# Patient Record
Sex: Female | Born: 1935 | Race: White | Hispanic: No
Health system: Southern US, Community
[De-identification: ages and names within clinical notes are randomized; demographics above are authoritative.]

---

## 2003-02-13 ENCOUNTER — Other Ambulatory Visit: Payer: Self-pay

## 2004-04-03 ENCOUNTER — Ambulatory Visit: Payer: Self-pay | Admitting: Ophthalmology

## 2004-04-08 ENCOUNTER — Ambulatory Visit: Payer: Self-pay | Admitting: Ophthalmology

## 2004-11-19 ENCOUNTER — Ambulatory Visit: Payer: Self-pay | Admitting: Internal Medicine

## 2004-12-25 ENCOUNTER — Ambulatory Visit: Payer: Self-pay | Admitting: Internal Medicine

## 2005-01-02 ENCOUNTER — Ambulatory Visit: Payer: Self-pay | Admitting: Internal Medicine

## 2005-07-06 ENCOUNTER — Ambulatory Visit: Payer: Self-pay | Admitting: Internal Medicine

## 2006-03-04 ENCOUNTER — Ambulatory Visit: Payer: Self-pay | Admitting: Internal Medicine

## 2006-06-23 ENCOUNTER — Inpatient Hospital Stay: Payer: Self-pay | Admitting: Internal Medicine

## 2007-03-17 ENCOUNTER — Ambulatory Visit: Payer: Self-pay | Admitting: Internal Medicine

## 2007-10-03 ENCOUNTER — Ambulatory Visit: Payer: Self-pay | Admitting: Unknown Physician Specialty

## 2007-10-10 ENCOUNTER — Inpatient Hospital Stay: Payer: Self-pay | Admitting: Unknown Physician Specialty

## 2007-10-15 ENCOUNTER — Encounter: Payer: Self-pay | Admitting: Internal Medicine

## 2007-11-05 ENCOUNTER — Encounter: Payer: Self-pay | Admitting: Internal Medicine

## 2007-11-23 ENCOUNTER — Ambulatory Visit: Payer: Self-pay | Admitting: Internal Medicine

## 2007-12-01 ENCOUNTER — Ambulatory Visit: Payer: Self-pay | Admitting: Internal Medicine

## 2007-12-06 ENCOUNTER — Ambulatory Visit: Payer: Self-pay | Admitting: Internal Medicine

## 2007-12-08 ENCOUNTER — Encounter: Payer: Self-pay | Admitting: Unknown Physician Specialty

## 2007-12-18 ENCOUNTER — Other Ambulatory Visit: Payer: Self-pay

## 2007-12-18 ENCOUNTER — Inpatient Hospital Stay: Payer: Self-pay | Admitting: Internal Medicine

## 2007-12-29 ENCOUNTER — Encounter: Payer: Self-pay | Admitting: Internal Medicine

## 2008-01-05 ENCOUNTER — Encounter: Payer: Self-pay | Admitting: Internal Medicine

## 2008-01-11 ENCOUNTER — Encounter: Payer: Self-pay | Admitting: Unknown Physician Specialty

## 2008-01-12 ENCOUNTER — Ambulatory Visit: Payer: Self-pay | Admitting: Internal Medicine

## 2008-02-05 ENCOUNTER — Ambulatory Visit: Payer: Self-pay | Admitting: Internal Medicine

## 2008-05-17 ENCOUNTER — Ambulatory Visit: Payer: Self-pay | Admitting: Internal Medicine

## 2008-07-19 ENCOUNTER — Ambulatory Visit: Payer: Self-pay | Admitting: Anesthesiology

## 2008-07-27 ENCOUNTER — Ambulatory Visit: Payer: Self-pay | Admitting: Anesthesiology

## 2008-08-15 ENCOUNTER — Inpatient Hospital Stay: Payer: Self-pay | Admitting: Internal Medicine

## 2008-09-03 ENCOUNTER — Ambulatory Visit: Payer: Self-pay | Admitting: Anesthesiology

## 2009-05-20 ENCOUNTER — Ambulatory Visit: Payer: Self-pay | Admitting: Internal Medicine

## 2009-10-28 ENCOUNTER — Ambulatory Visit: Payer: Self-pay | Admitting: Pain Medicine

## 2009-10-29 ENCOUNTER — Ambulatory Visit: Payer: Self-pay | Admitting: Pain Medicine

## 2009-11-11 ENCOUNTER — Ambulatory Visit: Payer: Self-pay | Admitting: Pain Medicine

## 2009-11-14 ENCOUNTER — Ambulatory Visit: Payer: Self-pay | Admitting: Pain Medicine

## 2009-11-27 ENCOUNTER — Ambulatory Visit: Payer: Self-pay | Admitting: Pain Medicine

## 2009-12-10 ENCOUNTER — Ambulatory Visit: Payer: Self-pay | Admitting: Pain Medicine

## 2009-12-23 ENCOUNTER — Ambulatory Visit: Payer: Self-pay | Admitting: Pain Medicine

## 2009-12-26 ENCOUNTER — Ambulatory Visit: Payer: Self-pay | Admitting: Pain Medicine

## 2010-01-09 ENCOUNTER — Ambulatory Visit: Payer: Self-pay | Admitting: Pain Medicine

## 2010-01-20 ENCOUNTER — Ambulatory Visit: Payer: Self-pay | Admitting: Pain Medicine

## 2010-05-15 ENCOUNTER — Ambulatory Visit: Payer: Self-pay | Admitting: Pain Medicine

## 2010-05-29 ENCOUNTER — Ambulatory Visit: Payer: Self-pay | Admitting: Pain Medicine

## 2010-06-16 ENCOUNTER — Ambulatory Visit: Payer: Self-pay | Admitting: Pain Medicine

## 2010-06-23 ENCOUNTER — Ambulatory Visit: Payer: Self-pay | Admitting: Internal Medicine

## 2010-07-21 ENCOUNTER — Inpatient Hospital Stay: Payer: Self-pay | Admitting: Internal Medicine

## 2010-07-25 LAB — CA 125: CA 125: 9.7 U/mL (ref 0.0–34.0)

## 2010-11-06 ENCOUNTER — Ambulatory Visit: Payer: Self-pay | Admitting: Pain Medicine

## 2010-11-20 ENCOUNTER — Ambulatory Visit: Payer: Self-pay | Admitting: Pain Medicine

## 2010-11-24 ENCOUNTER — Ambulatory Visit: Payer: Self-pay | Admitting: Pain Medicine

## 2010-11-27 ENCOUNTER — Ambulatory Visit: Payer: Self-pay

## 2010-12-15 ENCOUNTER — Ambulatory Visit: Payer: Self-pay

## 2010-12-26 ENCOUNTER — Other Ambulatory Visit: Payer: Self-pay | Admitting: Cardiology

## 2011-01-13 ENCOUNTER — Ambulatory Visit: Payer: Self-pay | Admitting: Cardiology

## 2011-02-02 ENCOUNTER — Inpatient Hospital Stay: Payer: Self-pay | Admitting: Internal Medicine

## 2011-02-20 ENCOUNTER — Inpatient Hospital Stay: Payer: Self-pay | Admitting: Internal Medicine

## 2011-03-03 ENCOUNTER — Encounter: Payer: Self-pay | Admitting: Internal Medicine

## 2011-03-07 ENCOUNTER — Encounter: Payer: Self-pay | Admitting: Internal Medicine

## 2011-03-19 ENCOUNTER — Emergency Department: Payer: Self-pay | Admitting: *Deleted

## 2011-04-07 ENCOUNTER — Encounter: Payer: Self-pay | Admitting: Internal Medicine

## 2011-04-30 ENCOUNTER — Other Ambulatory Visit: Payer: Self-pay

## 2011-04-30 ENCOUNTER — Inpatient Hospital Stay: Payer: Self-pay | Admitting: Internal Medicine

## 2011-04-30 LAB — BASIC METABOLIC PANEL
Anion Gap: 21 — ABNORMAL HIGH (ref 7–16)
BUN: 70 mg/dL — ABNORMAL HIGH (ref 7–18)
Chloride: 90 mmol/L — ABNORMAL LOW (ref 98–107)
Creatinine: 4.09 mg/dL — ABNORMAL HIGH (ref 0.60–1.30)
EGFR (African American): 14 — ABNORMAL LOW
EGFR (Non-African Amer.): 11 — ABNORMAL LOW
Glucose: 72 mg/dL (ref 65–99)
Osmolality: 284 (ref 275–301)
Potassium: 4.5 mmol/L (ref 3.5–5.1)

## 2011-04-30 LAB — HEPATIC FUNCTION PANEL A (ARMC)
Alkaline Phosphatase: 49 U/L — ABNORMAL LOW (ref 50–136)
Bilirubin, Direct: 0.4 mg/dL — ABNORMAL HIGH (ref 0.00–0.20)
SGOT(AST): 29 U/L (ref 15–37)
Total Protein: 7.5 g/dL (ref 6.4–8.2)

## 2011-04-30 LAB — COMPREHENSIVE METABOLIC PANEL
Albumin: 4.1 g/dL (ref 3.4–5.0)
Alkaline Phosphatase: 58 U/L (ref 50–136)
Anion Gap: 21 — ABNORMAL HIGH (ref 7–16)
BUN: 66 mg/dL — ABNORMAL HIGH (ref 7–18)
Co2: 20 mmol/L — ABNORMAL LOW (ref 21–32)
Creatinine: 3.35 mg/dL — ABNORMAL HIGH (ref 0.60–1.30)
Glucose: 79 mg/dL (ref 65–99)
Potassium: 4.7 mmol/L (ref 3.5–5.1)
SGOT(AST): 28 U/L (ref 15–37)
SGPT (ALT): 28 U/L
Sodium: 129 mmol/L — ABNORMAL LOW (ref 136–145)
Total Protein: 8.1 g/dL (ref 6.4–8.2)

## 2011-04-30 LAB — TROPONIN I: Troponin-I: <0.02 ng/mL

## 2011-05-01 LAB — CBC WITH DIFFERENTIAL/PLATELET
Basophil #: 0 10*3/uL (ref 0.0–0.1)
Basophil %: 0 %
Eosinophil #: 0 10*3/uL (ref 0.0–0.7)
Lymphocyte #: 0.3 10*3/uL — ABNORMAL LOW (ref 1.0–3.6)
Lymphocyte %: 3.1 %
MCH: 31.9 pg (ref 26.0–34.0)
MCV: 95 fL (ref 80–100)
Monocyte #: 0.7 10*3/uL (ref 0.0–0.7)
Monocyte %: 6.5 %
Neutrophil %: 90.4 %
Platelet: 133 10*3/uL — ABNORMAL LOW (ref 150–440)
RBC: 3.89 10*6/uL (ref 3.80–5.20)
RDW: 17 % — ABNORMAL HIGH (ref 11.5–14.5)
WBC: 10.9 10*3/uL (ref 3.6–11.0)

## 2011-05-01 LAB — URINALYSIS, COMPLETE
Blood: NEGATIVE
Nitrite: NEGATIVE
Ph: 5 (ref 4.5–8.0)
Specific Gravity: 1.012 (ref 1.003–1.030)

## 2011-05-01 LAB — COMPREHENSIVE METABOLIC PANEL
BUN: 69 mg/dL — ABNORMAL HIGH (ref 7–18)
Bilirubin,Total: 1.1 mg/dL — ABNORMAL HIGH (ref 0.2–1.0)
Calcium, Total: 8.4 mg/dL — ABNORMAL LOW (ref 8.5–10.1)
Chloride: 98 mmol/L (ref 98–107)
Co2: 15 mmol/L — ABNORMAL LOW (ref 21–32)
Creatinine: 3.41 mg/dL — ABNORMAL HIGH (ref 0.60–1.30)
EGFR (African American): 17 — ABNORMAL LOW
EGFR (Non-African Amer.): 14 — ABNORMAL LOW
Potassium: 5.7 mmol/L — ABNORMAL HIGH (ref 3.5–5.1)
SGOT(AST): 24 U/L (ref 15–37)
SGPT (ALT): 20 U/L

## 2011-05-01 LAB — TROPONIN I: Troponin-I: <0.02 ng/mL

## 2011-05-01 LAB — POTASSIUM: Potassium: 5.4 mmol/L — ABNORMAL HIGH (ref 3.5–5.1)

## 2011-05-02 LAB — CBC WITH DIFFERENTIAL/PLATELET
Eosinophil #: 0 10*3/uL (ref 0.0–0.7)
HCT: 33.7 % — ABNORMAL LOW (ref 35.0–47.0)
Lymphocyte #: 0.6 10*3/uL — ABNORMAL LOW (ref 1.0–3.6)
Lymphocyte %: 11.2 %
MCH: 31.3 pg (ref 26.0–34.0)
MCHC: 32.7 g/dL (ref 32.0–36.0)
MCV: 96 fL (ref 80–100)
Monocyte #: 0.8 10*3/uL — ABNORMAL HIGH (ref 0.0–0.7)
Monocyte %: 13.8 %
Neutrophil #: 4.1 10*3/uL (ref 1.4–6.5)
Platelet: 112 10*3/uL — ABNORMAL LOW (ref 150–440)
RDW: 16.9 % — ABNORMAL HIGH (ref 11.5–14.5)
WBC: 5.6 10*3/uL (ref 3.6–11.0)

## 2011-05-02 LAB — BASIC METABOLIC PANEL
Anion Gap: 13 (ref 7–16)
BUN: 60 mg/dL — ABNORMAL HIGH (ref 7–18)
Co2: 26 mmol/L (ref 21–32)
Creatinine: 2.27 mg/dL — ABNORMAL HIGH (ref 0.60–1.30)
EGFR (African American): 27 — ABNORMAL LOW
EGFR (Non-African Amer.): 22 — ABNORMAL LOW
Glucose: 86 mg/dL (ref 65–99)
Osmolality: 292 (ref 275–301)

## 2011-05-03 LAB — BASIC METABOLIC PANEL
BUN: 45 mg/dL — ABNORMAL HIGH (ref 7–18)
Calcium, Total: 8.7 mg/dL (ref 8.5–10.1)
Chloride: 99 mmol/L (ref 98–107)
Co2: 28 mmol/L (ref 21–32)
EGFR (African American): 41 — ABNORMAL LOW
Potassium: 3.5 mmol/L (ref 3.5–5.1)
Sodium: 139 mmol/L (ref 136–145)

## 2011-05-03 LAB — URINALYSIS, COMPLETE
Bilirubin,UR: NEGATIVE
Glucose,UR: NEGATIVE mg/dL (ref 0–75)
Nitrite: NEGATIVE
Ph: 5 (ref 4.5–8.0)
Specific Gravity: 1.016 (ref 1.003–1.030)
Squamous Epithelial: 3
WBC UR: 264 /HPF (ref 0–5)

## 2011-05-04 LAB — UR PROT ELECTROPHORESIS, URINE RANDOM

## 2011-05-05 LAB — BASIC METABOLIC PANEL
Calcium, Total: 8.7 mg/dL (ref 8.5–10.1)
Chloride: 103 mmol/L (ref 98–107)
Co2: 27 mmol/L (ref 21–32)
Creatinine: 1.3 mg/dL (ref 0.60–1.30)
EGFR (Non-African Amer.): 42 — ABNORMAL LOW
Potassium: 3.4 mmol/L — ABNORMAL LOW (ref 3.5–5.1)
Sodium: 141 mmol/L (ref 136–145)

## 2011-05-05 LAB — URINE CULTURE

## 2011-05-05 LAB — PROTEIN ELECTROPHORESIS(ARMC)

## 2011-05-06 LAB — URINE CULTURE

## 2011-05-06 LAB — PATHOLOGY REPORT

## 2011-05-06 LAB — BASIC METABOLIC PANEL
Anion Gap: 13 (ref 7–16)
Calcium, Total: 8.8 mg/dL (ref 8.5–10.1)
Co2: 23 mmol/L (ref 21–32)
EGFR (African American): 52 — ABNORMAL LOW
Osmolality: 283 (ref 275–301)
Sodium: 141 mmol/L (ref 136–145)

## 2011-05-08 ENCOUNTER — Ambulatory Visit: Payer: Self-pay | Admitting: Internal Medicine

## 2011-05-08 LAB — BASIC METABOLIC PANEL
Anion Gap: 13 (ref 7–16)
Chloride: 110 mmol/L — ABNORMAL HIGH (ref 98–107)
Creatinine: 1.16 mg/dL (ref 0.60–1.30)
EGFR (African American): 58 — ABNORMAL LOW
Glucose: 72 mg/dL (ref 65–99)

## 2011-05-08 LAB — CLOSTRIDIUM DIFFICILE BY PCR

## 2011-05-08 LAB — PLATELET COUNT: Platelet: 152 10*3/uL (ref 150–440)

## 2011-05-08 LAB — HEMATOCRIT: HCT: 33.2 % — ABNORMAL LOW (ref 35.0–47.0)

## 2011-05-09 LAB — BASIC METABOLIC PANEL
BUN: 16 mg/dL (ref 7–18)
Calcium, Total: 8.8 mg/dL (ref 8.5–10.1)
Chloride: 108 mmol/L — ABNORMAL HIGH (ref 98–107)
EGFR (Non-African Amer.): 44 — ABNORMAL LOW
Glucose: 71 mg/dL (ref 65–99)
Potassium: 3.4 mmol/L — ABNORMAL LOW (ref 3.5–5.1)
Sodium: 143 mmol/L (ref 136–145)

## 2011-05-09 LAB — MAGNESIUM: Magnesium: 1.8 mg/dL

## 2011-05-10 LAB — BASIC METABOLIC PANEL
Chloride: 107 mmol/L (ref 98–107)
Co2: 25 mmol/L (ref 21–32)
Creatinine: 1.4 mg/dL — ABNORMAL HIGH (ref 0.60–1.30)
Glucose: 73 mg/dL (ref 65–99)
Potassium: 4 mmol/L (ref 3.5–5.1)
Sodium: 142 mmol/L (ref 136–145)

## 2011-05-10 LAB — PLATELET COUNT: Platelet: 164 10*3/uL (ref 150–440)

## 2011-05-10 LAB — HEMATOCRIT: HCT: 32.1 % — ABNORMAL LOW (ref 35.0–47.0)

## 2011-05-12 LAB — BASIC METABOLIC PANEL
Anion Gap: 11 (ref 7–16)
BUN: 17 mg/dL (ref 7–18)
Calcium, Total: 8.7 mg/dL (ref 8.5–10.1)
Creatinine: 1.2 mg/dL (ref 0.60–1.30)
EGFR (African American): 56 — ABNORMAL LOW
EGFR (Non-African Amer.): 46 — ABNORMAL LOW
Glucose: 74 mg/dL (ref 65–99)
Osmolality: 281 (ref 275–301)

## 2011-05-12 LAB — CBC WITH DIFFERENTIAL/PLATELET
Basophil #: 0 10*3/uL (ref 0.0–0.1)
Eosinophil #: 0 10*3/uL (ref 0.0–0.7)
HCT: 31.3 % — ABNORMAL LOW (ref 35.0–47.0)
HGB: 10.1 g/dL — ABNORMAL LOW (ref 12.0–16.0)
Lymphocyte %: 14.5 %
MCH: 31.1 pg (ref 26.0–34.0)
MCHC: 32.3 g/dL (ref 32.0–36.0)
MCV: 96 fL (ref 80–100)
Neutrophil %: 77 %
RBC: 3.25 10*6/uL — ABNORMAL LOW (ref 3.80–5.20)
WBC: 7.1 10*3/uL (ref 3.6–11.0)

## 2011-05-14 LAB — COMPREHENSIVE METABOLIC PANEL
Albumin: 2.5 g/dL — ABNORMAL LOW (ref 3.4–5.0)
Calcium, Total: 8.6 mg/dL (ref 8.5–10.1)
Chloride: 107 mmol/L (ref 98–107)
Co2: 23 mmol/L (ref 21–32)
SGOT(AST): 18 U/L (ref 15–37)
SGPT (ALT): 23 U/L
Sodium: 140 mmol/L (ref 136–145)
Total Protein: 5.2 g/dL — ABNORMAL LOW (ref 6.4–8.2)

## 2011-05-14 LAB — APTT: Activated PTT: 29.8 secs (ref 23.6–35.9)

## 2011-05-14 LAB — MAGNESIUM: Magnesium: 1.8 mg/dL

## 2011-05-15 LAB — URINALYSIS, COMPLETE
Bacteria: NONE SEEN
Bilirubin,UR: NEGATIVE
Glucose,UR: NEGATIVE mg/dL (ref 0–75)
Leukocyte Esterase: NEGATIVE
Nitrite: NEGATIVE
RBC,UR: 18 /HPF (ref 0–5)
Squamous Epithelial: 1
WBC UR: 5 /HPF (ref 0–5)

## 2011-05-15 LAB — COMPREHENSIVE METABOLIC PANEL
Alkaline Phosphatase: 35 U/L — ABNORMAL LOW (ref 50–136)
Anion Gap: 13 (ref 7–16)
BUN: 16 mg/dL (ref 7–18)
Creatinine: 0.91 mg/dL (ref 0.60–1.30)
EGFR (African American): >60
EGFR (Non-African Amer.): >60
Osmolality: 280 (ref 275–301)
SGPT (ALT): 23 U/L
Total Protein: 5.5 g/dL — ABNORMAL LOW (ref 6.4–8.2)

## 2011-05-16 LAB — PLATELET COUNT: Platelet: 159 10*3/uL (ref 150–440)

## 2011-05-19 LAB — CBC WITH DIFFERENTIAL/PLATELET
Basophil #: 0 10*3/uL (ref 0.0–0.1)
Basophil %: 0.7 %
Eosinophil %: 2.3 %
HCT: 28.6 % — ABNORMAL LOW (ref 35.0–47.0)
HGB: 9.5 g/dL — ABNORMAL LOW (ref 12.0–16.0)
Lymphocyte #: 1.2 10*3/uL (ref 1.0–3.6)
MCH: 32 pg (ref 26.0–34.0)
Monocyte #: 0.5 10*3/uL (ref 0.0–0.7)
Monocyte %: 10.5 %
RBC: 2.98 10*6/uL — ABNORMAL LOW (ref 3.80–5.20)
RDW: 16.8 % — ABNORMAL HIGH (ref 11.5–14.5)
WBC: 4.6 10*3/uL (ref 3.6–11.0)

## 2011-05-19 LAB — BASIC METABOLIC PANEL
Anion Gap: 11 (ref 7–16)
BUN: 11 mg/dL (ref 7–18)
Chloride: 106 mmol/L (ref 98–107)
Co2: 27 mmol/L (ref 21–32)
EGFR (Non-African Amer.): 60 — ABNORMAL LOW
Osmolality: 286 (ref 275–301)

## 2011-05-19 LAB — MAGNESIUM: Magnesium: 2.4 mg/dL

## 2011-05-20 LAB — BASIC METABOLIC PANEL
Anion Gap: 9 (ref 7–16)
BUN: 9 mg/dL (ref 7–18)
Calcium, Total: 8.1 mg/dL — ABNORMAL LOW (ref 8.5–10.1)
Chloride: 107 mmol/L (ref 98–107)
Creatinine: 0.95 mg/dL (ref 0.60–1.30)
EGFR (Non-African Amer.): >60
Osmolality: 282 (ref 275–301)
Potassium: 3.6 mmol/L (ref 3.5–5.1)
Sodium: 142 mmol/L (ref 136–145)

## 2011-05-21 LAB — COMPREHENSIVE METABOLIC PANEL
Albumin: 2.5 g/dL — ABNORMAL LOW (ref 3.4–5.0)
Alkaline Phosphatase: 37 U/L — ABNORMAL LOW (ref 50–136)
Anion Gap: 6 — ABNORMAL LOW (ref 7–16)
Calcium, Total: 8.6 mg/dL (ref 8.5–10.1)
Co2: 26 mmol/L (ref 21–32)
Creatinine: 1.08 mg/dL (ref 0.60–1.30)
Glucose: 74 mg/dL (ref 65–99)
Potassium: 4.1 mmol/L (ref 3.5–5.1)
SGOT(AST): 27 U/L (ref 15–37)
SGPT (ALT): 29 U/L
Sodium: 138 mmol/L (ref 136–145)
Total Protein: 5.2 g/dL — ABNORMAL LOW (ref 6.4–8.2)

## 2011-05-21 LAB — CBC WITH DIFFERENTIAL/PLATELET
Basophil #: 0 10*3/uL (ref 0.0–0.1)
Basophil %: 0.4 %
HCT: 30.1 % — ABNORMAL LOW (ref 35.0–47.0)
HGB: 9.9 g/dL — ABNORMAL LOW (ref 12.0–16.0)
Lymphocyte %: 20.3 %
MCH: 31.7 pg (ref 26.0–34.0)
MCHC: 32.7 g/dL (ref 32.0–36.0)
Monocyte %: 7.8 %
Neutrophil #: 4.1 10*3/uL (ref 1.4–6.5)
RDW: 17.2 % — ABNORMAL HIGH (ref 11.5–14.5)
WBC: 6.1 10*3/uL (ref 3.6–11.0)

## 2011-05-21 LAB — PROTIME-INR
INR: 0.9
Prothrombin Time: 12.1 secs (ref 11.5–14.7)

## 2011-05-22 LAB — CBC WITH DIFFERENTIAL/PLATELET
Basophil %: 0 %
Eosinophil #: 0 10*3/uL (ref 0.0–0.7)
Eosinophil %: 0.1 %
HCT: 29.5 % — ABNORMAL LOW (ref 35.0–47.0)
HGB: 9.8 g/dL — ABNORMAL LOW (ref 12.0–16.0)
Lymphocyte #: 0.6 10*3/uL — ABNORMAL LOW (ref 1.0–3.6)
MCHC: 33.1 g/dL (ref 32.0–36.0)
MCV: 97 fL (ref 80–100)
Monocyte %: 5.3 %
Neutrophil #: 11.6 10*3/uL — ABNORMAL HIGH (ref 1.4–6.5)
Neutrophil %: 89.7 %
WBC: 12.9 10*3/uL — ABNORMAL HIGH (ref 3.6–11.0)

## 2011-05-22 LAB — COMPREHENSIVE METABOLIC PANEL
Albumin: 2.4 g/dL — ABNORMAL LOW (ref 3.4–5.0)
Alkaline Phosphatase: 38 U/L — ABNORMAL LOW (ref 50–136)
Bilirubin,Total: 0.4 mg/dL (ref 0.2–1.0)
Calcium, Total: 8.5 mg/dL (ref 8.5–10.1)
Chloride: 107 mmol/L (ref 98–107)
Co2: 23 mmol/L (ref 21–32)
Creatinine: 1.2 mg/dL (ref 0.60–1.30)
Glucose: 118 mg/dL — ABNORMAL HIGH (ref 65–99)
Osmolality: 278 (ref 275–301)
SGOT(AST): 52 U/L — ABNORMAL HIGH (ref 15–37)
SGPT (ALT): 66 U/L
Total Protein: 5 g/dL — ABNORMAL LOW (ref 6.4–8.2)

## 2011-05-22 LAB — POTASSIUM: Potassium: 5.1 mmol/L (ref 3.5–5.1)

## 2011-05-23 LAB — BASIC METABOLIC PANEL
Calcium, Total: 9.2 mg/dL (ref 8.5–10.1)
Co2: 24 mmol/L (ref 21–32)
EGFR (African American): 57 — ABNORMAL LOW
EGFR (Non-African Amer.): 47 — ABNORMAL LOW
Glucose: 84 mg/dL (ref 65–99)
Potassium: 4.5 mmol/L (ref 3.5–5.1)
Sodium: 142 mmol/L (ref 136–145)

## 2011-05-23 LAB — CBC WITH DIFFERENTIAL/PLATELET
Basophil %: 0.4 %
Eosinophil %: 3.3 %
HGB: 8.8 g/dL — ABNORMAL LOW (ref 12.0–16.0)
Lymphocyte #: 1 10*3/uL (ref 1.0–3.6)
MCH: 31.9 pg (ref 26.0–34.0)
MCV: 96 fL (ref 80–100)
Monocyte #: 0.4 10*3/uL (ref 0.0–0.7)
Neutrophil %: 72.7 %
RBC: 2.75 10*6/uL — ABNORMAL LOW (ref 3.80–5.20)
WBC: 6.2 10*3/uL (ref 3.6–11.0)

## 2011-05-24 LAB — BASIC METABOLIC PANEL
Calcium, Total: 9.3 mg/dL (ref 8.5–10.1)
Chloride: 106 mmol/L (ref 98–107)
Creatinine: 1.23 mg/dL (ref 0.60–1.30)
EGFR (African American): 54 — ABNORMAL LOW
EGFR (Non-African Amer.): 45 — ABNORMAL LOW
Glucose: 79 mg/dL (ref 65–99)
Osmolality: 283 (ref 275–301)
Potassium: 4.2 mmol/L (ref 3.5–5.1)
Sodium: 143 mmol/L (ref 136–145)

## 2011-05-24 LAB — CBC WITH DIFFERENTIAL/PLATELET
Basophil %: 0.6 %
Eosinophil %: 5.1 %
HCT: 27.3 % — ABNORMAL LOW (ref 35.0–47.0)
HGB: 8.9 g/dL — ABNORMAL LOW (ref 12.0–16.0)
Lymphocyte #: 1.3 10*3/uL (ref 1.0–3.6)
Lymphocyte %: 23.6 %
MCV: 96 fL (ref 80–100)
Monocyte %: 9.4 %
Neutrophil #: 3.3 10*3/uL (ref 1.4–6.5)
Platelet: 152 10*3/uL (ref 150–440)
WBC: 5.4 10*3/uL (ref 3.6–11.0)

## 2011-05-26 LAB — CBC WITH DIFFERENTIAL/PLATELET
Basophil #: 0 10*3/uL (ref 0.0–0.1)
Basophil %: 0.5 %
Eosinophil %: 6.3 %
HGB: 9.1 g/dL — ABNORMAL LOW (ref 12.0–16.0)
Lymphocyte #: 1.3 10*3/uL (ref 1.0–3.6)
Lymphocyte %: 24 %
MCH: 32 pg (ref 26.0–34.0)
Monocyte %: 7.9 %
Neutrophil %: 61.3 %
Platelet: 153 10*3/uL (ref 150–440)
RBC: 2.86 10*6/uL — ABNORMAL LOW (ref 3.80–5.20)
WBC: 5.4 10*3/uL (ref 3.6–11.0)

## 2011-05-26 LAB — BASIC METABOLIC PANEL
Anion Gap: 9 (ref 7–16)
BUN: 8 mg/dL (ref 7–18)
Calcium, Total: 9.3 mg/dL (ref 8.5–10.1)
Chloride: 108 mmol/L — ABNORMAL HIGH (ref 98–107)
EGFR (Non-African Amer.): 42 — ABNORMAL LOW
Glucose: 74 mg/dL (ref 65–99)
Osmolality: 280 (ref 275–301)
Potassium: 3.7 mmol/L (ref 3.5–5.1)

## 2011-05-27 LAB — COMPREHENSIVE METABOLIC PANEL
Albumin: 2.5 g/dL — ABNORMAL LOW (ref 3.4–5.0)
Anion Gap: 12 (ref 7–16)
BUN: 8 mg/dL (ref 7–18)
Bilirubin,Total: 0.8 mg/dL (ref 0.2–1.0)
Co2: 25 mmol/L (ref 21–32)
Creatinine: 1.36 mg/dL — ABNORMAL HIGH (ref 0.60–1.30)
EGFR (African American): 48 — ABNORMAL LOW
EGFR (Non-African Amer.): 40 — ABNORMAL LOW
Glucose: 73 mg/dL (ref 65–99)
Potassium: 3.7 mmol/L (ref 3.5–5.1)
SGOT(AST): 19 U/L (ref 15–37)
Sodium: 144 mmol/L (ref 136–145)
Total Protein: 4.9 g/dL — ABNORMAL LOW (ref 6.4–8.2)

## 2011-05-28 LAB — BASIC METABOLIC PANEL
Calcium, Total: 9.5 mg/dL (ref 8.5–10.1)
Chloride: 107 mmol/L (ref 98–107)
Co2: 27 mmol/L (ref 21–32)
Creatinine: 1.39 mg/dL — ABNORMAL HIGH (ref 0.60–1.30)
EGFR (African American): 47 — ABNORMAL LOW
Sodium: 143 mmol/L (ref 136–145)

## 2011-05-29 LAB — BASIC METABOLIC PANEL
Anion Gap: 11 (ref 7–16)
BUN: 10 mg/dL (ref 7–18)
Calcium, Total: 9.3 mg/dL (ref 8.5–10.1)
Chloride: 106 mmol/L (ref 98–107)
Co2: 26 mmol/L (ref 21–32)
Creatinine: 1.38 mg/dL — ABNORMAL HIGH (ref 0.60–1.30)
EGFR (African American): 48 — ABNORMAL LOW
Potassium: 4.3 mmol/L (ref 3.5–5.1)
Sodium: 143 mmol/L (ref 136–145)

## 2011-05-30 ENCOUNTER — Encounter: Payer: Self-pay | Admitting: Internal Medicine

## 2011-06-05 ENCOUNTER — Ambulatory Visit: Payer: Self-pay | Admitting: Internal Medicine

## 2011-07-06 DEATH — deceased

## 2012-04-22 IMAGING — CT CT ABD-PELV W/O CM
1 of 2 series · 15 of 32 positions shown, 19 images · non-contrast
Comparison: none

REASON FOR EXAM: (1) abd distension - large gas in colon - eval for
volvulus; (2) abd distension
COMMENTS:

PROCEDURE:     CT  - CT ABDOMEN AND PELVIS W[DATE]  [DATE]
RESULT:     History: Abdominal distention.
Comparison Study: Prior CT of 02/09/2011.

[Series 2: soft tissue · axial · 0.90mm/px · z∈[-1138,-704]mm · 15 of 159 slices shown, 19 images]
[im 7/159  soft-tissue]
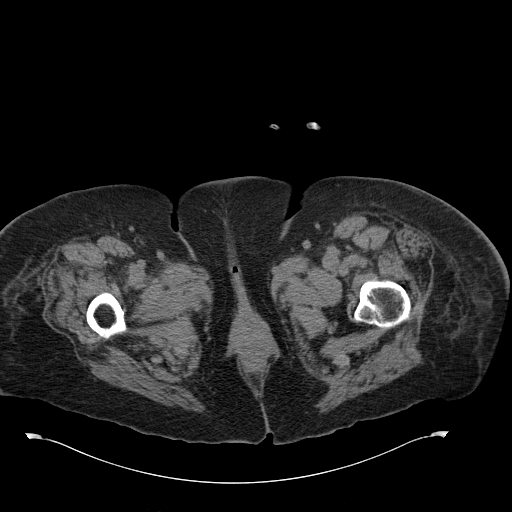
[im 7/159  bone]
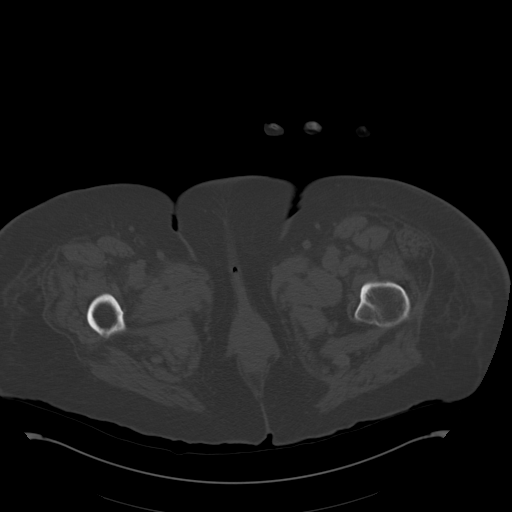
[im 20/159  soft-tissue]
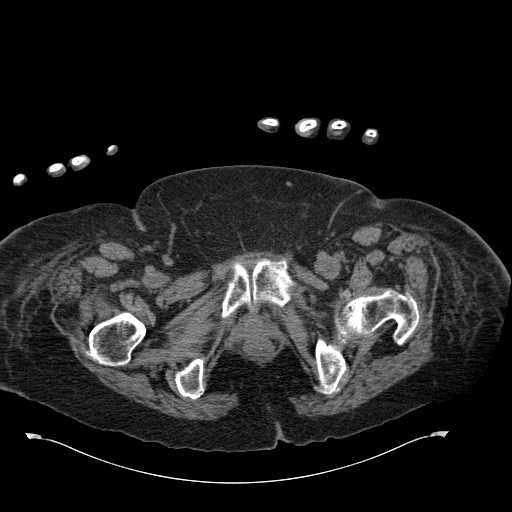
[im 33/159  soft-tissue]
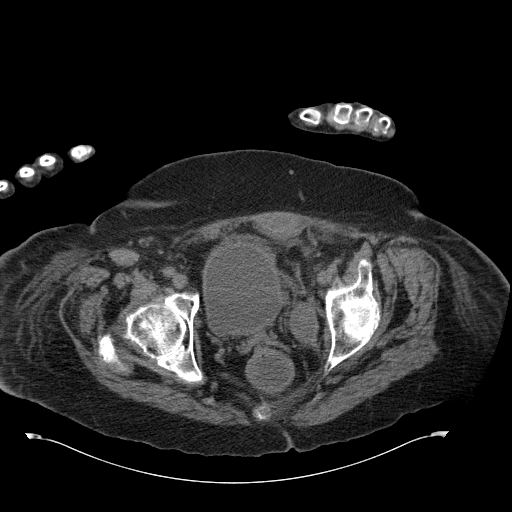
[im 47/159  soft-tissue]
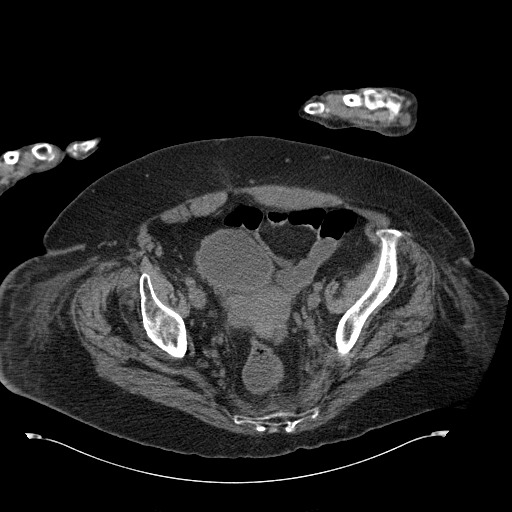
[im 53/159  soft-tissue]
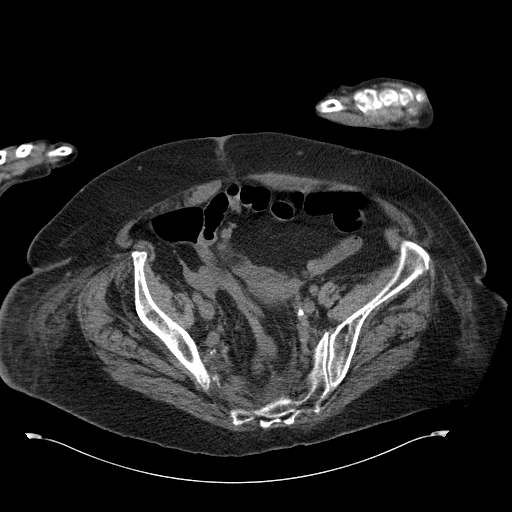
[im 66/159  soft-tissue]
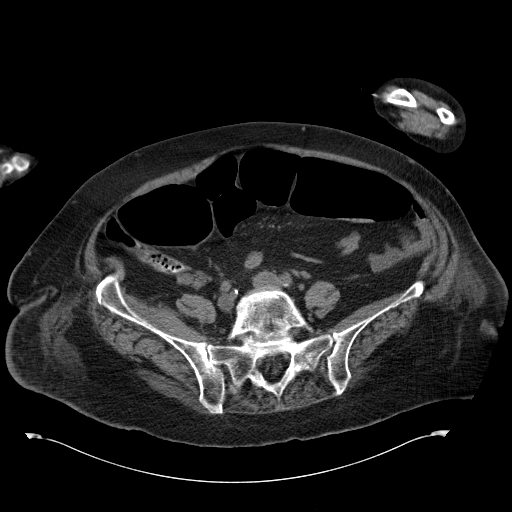
[im 80/159  soft-tissue]
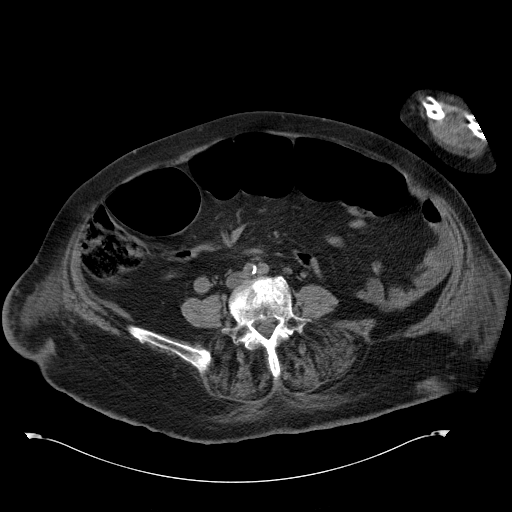
[im 93/159  soft-tissue]
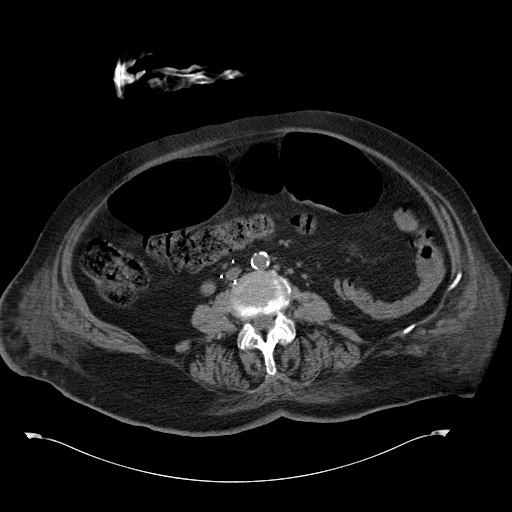
[im 106/159  soft-tissue]
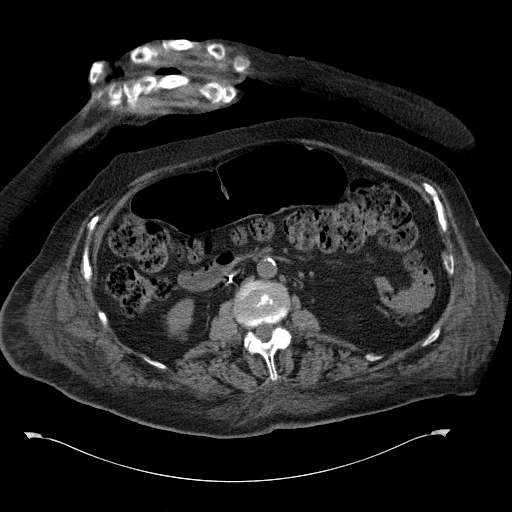
[im 106/159  bone]
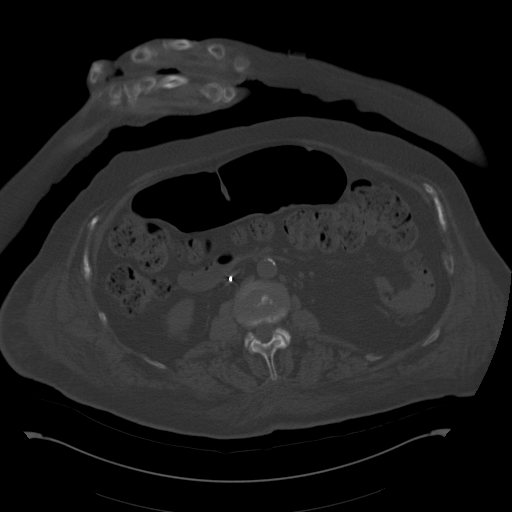
[im 112/159  soft-tissue]
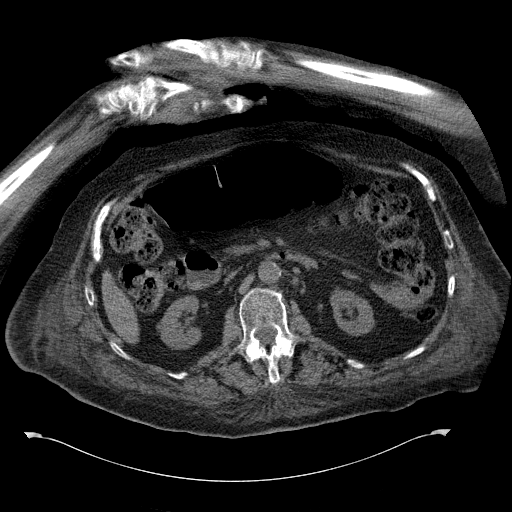
[im 126/159  soft-tissue]
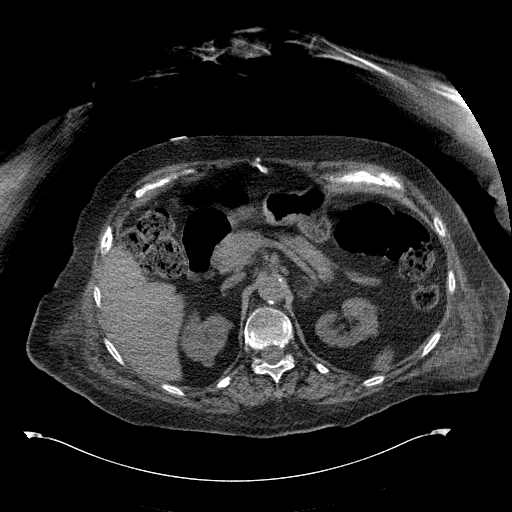
[im 132/159  lung]
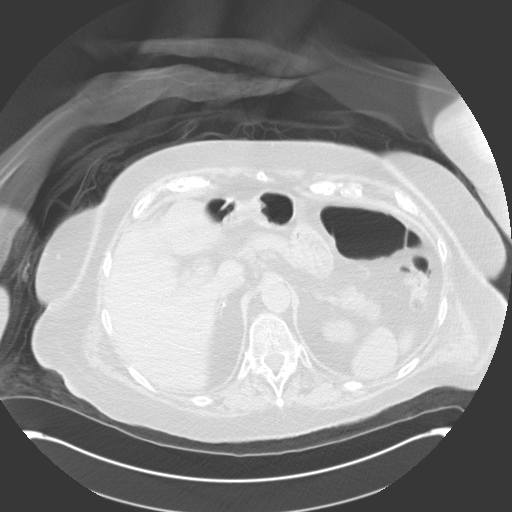
[im 139/159  soft-tissue]
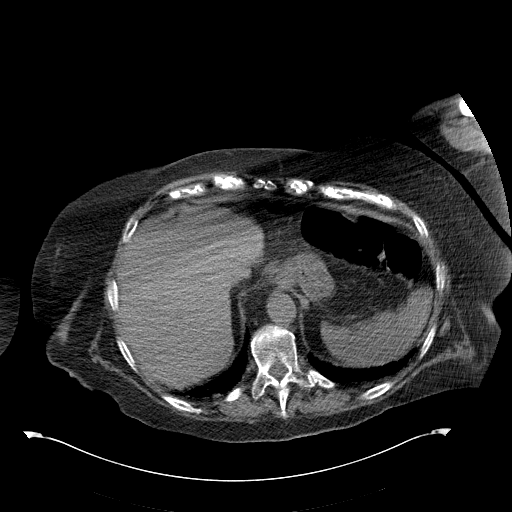
[im 139/159  lung]
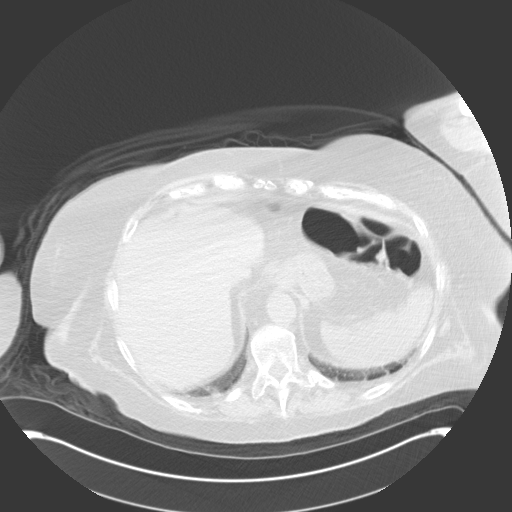
[im 145/159  lung]
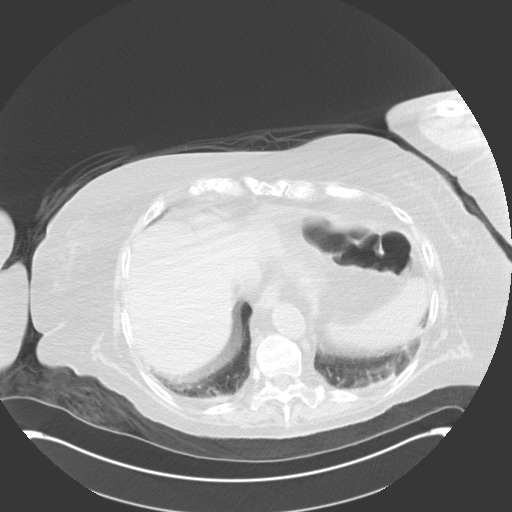
[im 152/159  soft-tissue]
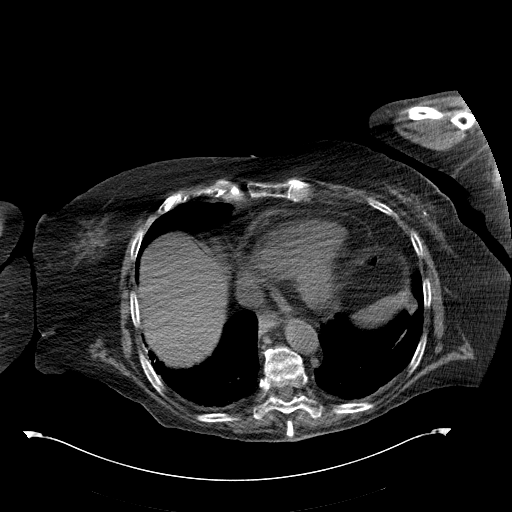
[im 152/159  lung]
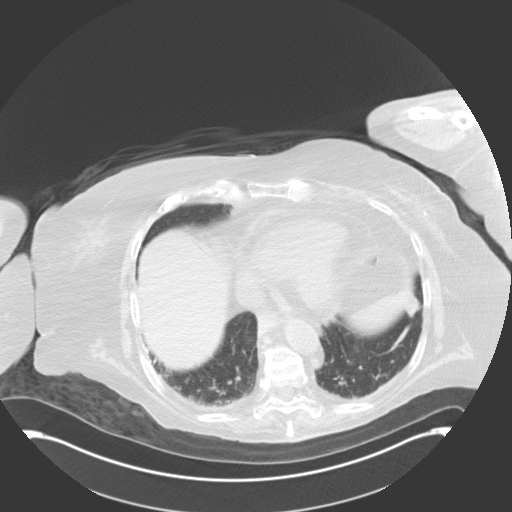

[15 of 32 positions shown; findings below may reference images not displayed]

FINDING: Standard nonenhanced CT is obtained. Small pericardial effusion is
present. Liver normal; surgical clips in the gallbladder fossa, spleen
normal. Pancreas is normal .Left adrenal fatty lesion consistent with
adrenal myolipoma present. This is stable. Aorta nondistended .Inferior vena
cava filter in good position. Previously identified left lower pelvic and
rectus sheath hemorrhage has improved in appearance from prior study of
02/09/2011. Atelectasis the lung bases. No free air. Prominent distention of
the sigmoid colon is noted. Follow up abdominal series suggested to exclude
developing quadrants. No evidence of colonic obstruction.
IMPRESSION: 1.     Improvement rectus sheath and left lower quadrant hematoma.
2.     Sigmoid colonic distention. Followup abdominal series suggested to
demonstrate resolution.

## 2012-04-22 IMAGING — CR DG CHEST 1V PORT
1 series · 1 of 1 positions shown · non-contrast
Comparison: none

REASON FOR EXAM: Chest Pain
COMMENTS:

PROCEDURE:     DXR - DXR PORTABLE CHEST SINGLE VIEW  - March 19, 2011  [DATE]
RESULT:     Comparison: 02/02/2011

[portable]
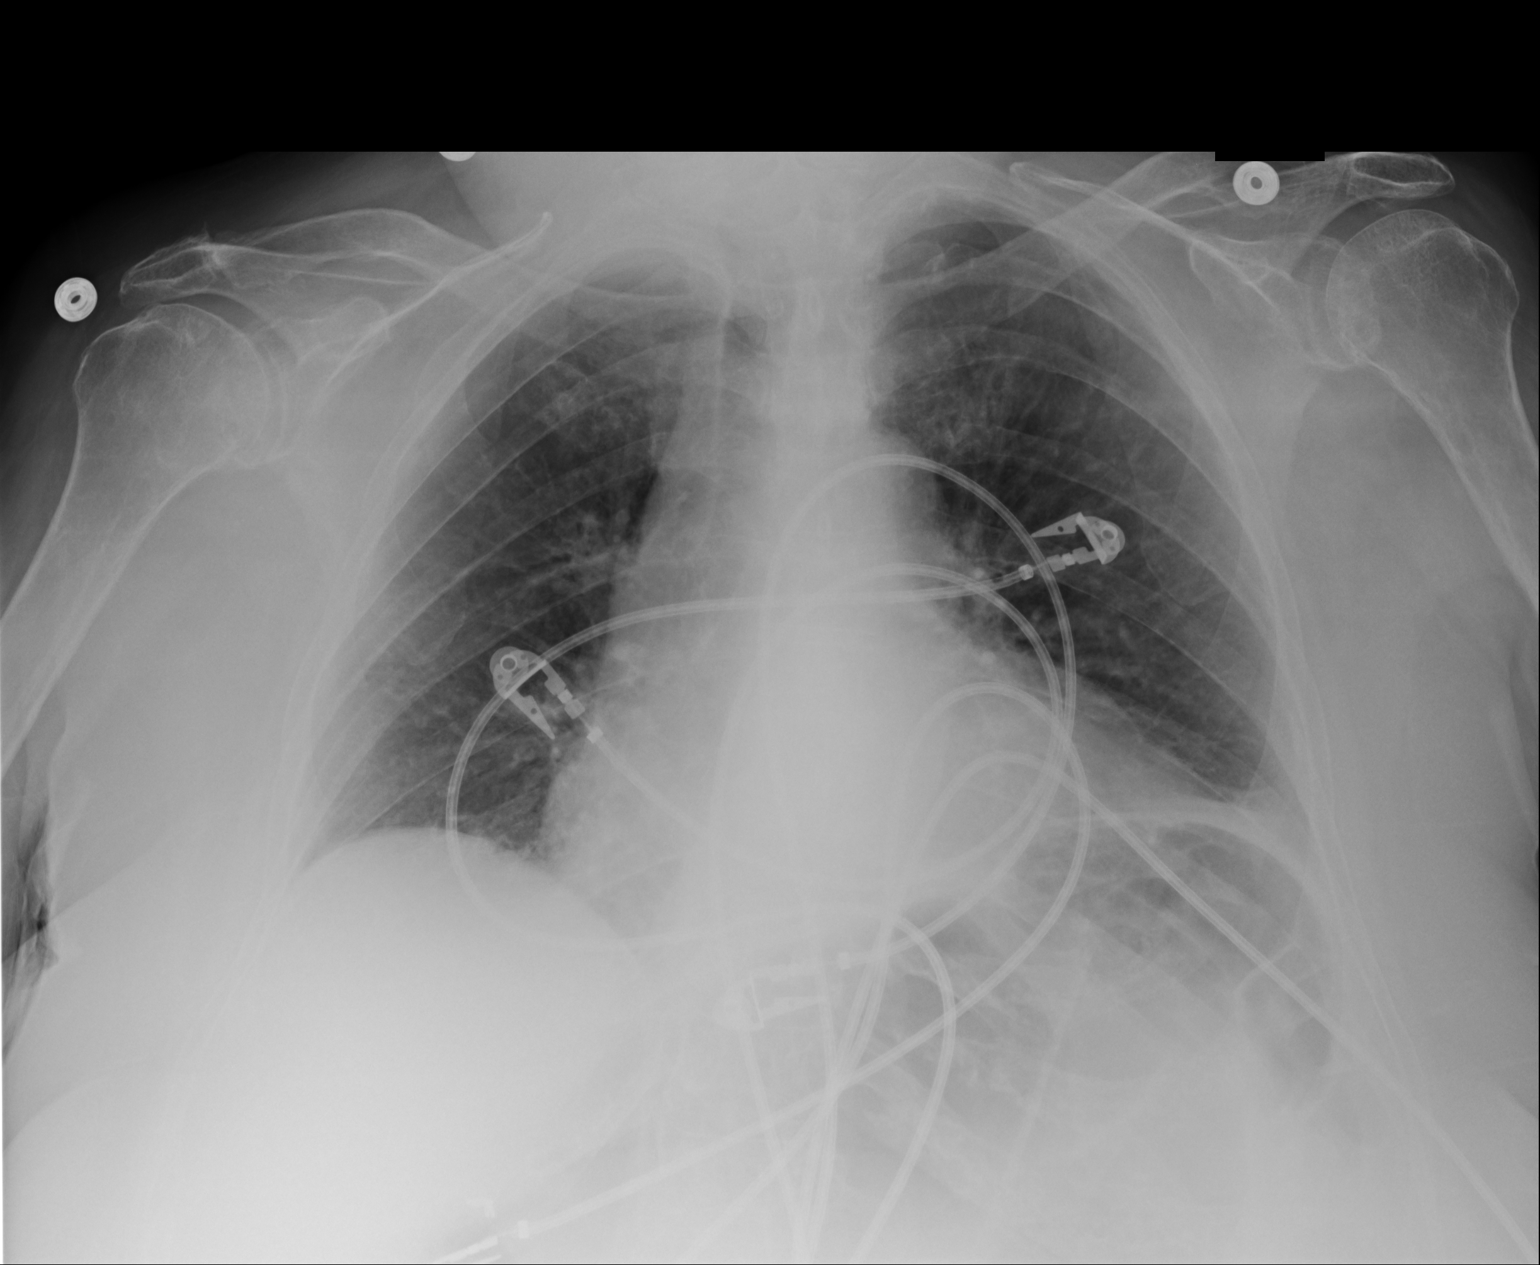

[1 of 1 positions shown; findings below may reference images not displayed]

FINDINGS: The lung volumes are low. Clinically and the mediastinum are similar to
prior. No focal pulmonary opacities. Lucencies overlying the left upper
quadrant likely secondary to air within bowel.
IMPRESSION: No acute cardiopulmonary disease.

## 2014-07-29 NOTE — Consult Note (Signed)
Pt for surgery tomorrow.  I will sign off, reconsult if can be of service.  Electronic Signatures: Scot JunElliott, Latessa Tillis T (MD)  (Signed on 13-Feb-13 16:22)  Authored  Last Updated: 13-Feb-13 16:22 by Scot JunElliott, Aija Scarfo T (MD)

## 2014-07-29 NOTE — Consult Note (Signed)
Pt EGD showed minimal erythematous patchy areas in body of stomach.  Bx done.  Small hiatal hernia?,  no obstruction, no ulcers, no neoplasm. recommend trial of advancement to full liquid diet.    Electronic Signatures: Scot JunElliott, Meygan Kyser T (MD)  (Signed on 29-Jan-13 11:16)  Authored  Last Updated: 29-Jan-13 11:16 by Scot JunElliott, Maysie Parkhill T (MD)

## 2014-07-29 NOTE — Consult Note (Signed)
PATIENT NAME:  Brittney, Hall MR#:  [id number] DATE OF BIRTH:  15-Jun-1929  DATE OF CONSULTATION:  04/30/2011  REFERRING PHYSICIAN:  Adin Hector, MD  CONSULTING PHYSICIAN:  Gaylyn Cheers, MD/Dominick Zertuche A. Jerelene Redden, ANP  REASON FOR CONSULTATION: Nausea and vomiting.   HISTORY OF PRESENT ILLNESS: This 81 year old patient has a past medical history of recent hospitalization for acute renal failure, dehydration, rectus sheath hematoma with acute blood loss anemia requiring blood transfusion, atonic colon with pseudo-volvulus, and has been recovering from her last hospitalization at [facility]. The patient is brought into the [facility] office today by daughter, Willa Frater, for concern over excessive gas in the stomach, inability to eat, weight loss, and nausea and vomiting.   The daughter reports that in 2012 the patient had a history of PE, deep vein thrombosis, IVC placement last year, steroids for back pain, shortness of breath and chronic obstructive pulmonary disease exacerbation in October. She had a couple of cardiac catheterizations and hospitalization as noted with a volvulus. She had a rectus sheath hematoma that was quite impressive. Dr. Vira Agar was consulted on her case during that admission. She has had problems with gas in her chest, bloating, nausea, vomiting within the first few bites. She is only consuming about 4 ounces of liquid nutrition. The daughter reports the patient vomits when she eats. The patient reports she does not want anything and has no appetite. The patient reports she is swallowing pills fine. Nausea is daily, vomiting is with every meal; and the daughter reports the patient has lost 20 pounds since October, and 50 pounds overall from baseline. The patient was ambulating with Physical Therapy help in December, and over this week she has had progressive weakness and could not get out of bed yesterday. The patient was brought to the Emergency Room on December  13th for nausea and vomiting. X-ray studies were done, and she was sent home on low-dose metoclopramide b.i.d. which has helped minimally. The daughter reports the patient has had decreased urinary output, poor stool with continued constipation, and overall failure to thrive. The Remeron dose was recently increased and there has been no improvement in appetite.   While the patient was in the clinic awaiting laboratory studies, she remained in the wheelchair and had a drop in blood pressure from admission 128/76 to 80/palp. The patient did become clammy, visibly diaphoretic, and had increasing weakness and lethargy. The CBC returned unremarkable. The INR was 1.1, and a stat MET-B was pending. The decision was made to admit the patient to the hospital for continued evaluation and management. Dr. Vira Agar was consulted into the room to evaluate the patient. Dr. Caryl Comes evaluated the patient in the office as well. GI has been consulted for continued assistance and problem with nausea, vomiting, concern over possible constipation/obstipation, and nutritional failure to thrive.   PAST MEDICAL HISTORY:  1. History of atonic colon with pseudo-volvulus, endoscopy performed November 2012.  2. History of recurrent dehydration with admission for acute renal failure in November 2012.  3. History of pulmonary embolism, including an episode when she was on anticoagulation, status post IVC filter placement.  4. History of rectus sheath hematoma with acute blood loss anemia in 2012 requiring transfusion.  5. Depression with anxiety.  6. Hypothyroidism.  7. Degenerative joint disease, history of chronic prednisone therapy secondary to intolerance of medical management.  8. Gastroesophageal reflux disease.  9. History of active bladder with previous hospitalization for urinary obstruction.  10. Hypertension.  11. Chronic  obstructive pulmonary disease.  12. Meniere's disease.  13. Chronic hearing loss.  14. Cardiomegaly.   15. Pancytopenia.  16. Deep vein thrombosis after right knee replacement.   PAST SURGICAL HISTORY:  1. Total right knee replacement in 2009. 2. Appendectomy.  3. Tympanostomy tube placed on the left.  4. Cholecystectomy in 2004.  5. Cardiac catheterization, both left and right, in 2012 without significant coronary artery disease.  6. Upper endoscopy 02/24/2011 for epigastric pain with nausea and vomiting with a small hiatus hernia present, some spasm in the upper esophagus but no stricture. The stomach was normal. Examined duodenum was normal.  7. Colonoscopy 02/24/2011 for abnormal x-ray, advanced to the transverse colon without difficulty after enemas provided. No ulcers, neoplasm, or inflamed mucosa was seen in the colon that was available. There was a clear area of demarcation where there was clean colon versus dirty colon to suggest colonic inertia or partial volvulus. Internal hemorrhoids were found.   MEDICATIONS: 1. Citalopram 20 mg, 1/2 tablet by mouth once a day.  2. MiraLax 17 grams dose, water once daily.  3. Prednisone 5 mg daily.  4. Synthroid 125 mcg daily.  5. Zyrtec 10 mg once daily.  6. Potassium chloride 10 mEq b.i.d.  7. Senna Plus b.i.d.  8. Oxygen as needed.  9. Benadryl 25 mg every 4 hours as needed.  10. Ondansetron 4 mg every 6 hours as needed.  11. Pantoprazole 40 mg b.i.d.  12. Remeron 30 mg at bedtime.  13. Metoclopramide 5 mg b.i.d. 14. Torsemide 20 mg daily.  15. Zyrtec 10 mg once daily.   NOTE: Align on hold.   ALLERGIES: No known drug allergies.   HABITS: Negative tobacco or alcohol.   FAMILY HISTORY: Not reviewed.   REVIEW OF SYSTEMS: CONSTITUTIONAL: Positive for overall weakness. No fevers or chills. Decreased energy, appetite. GASTROINTESTINAL: As noted. Chest discomfort described as gas pressure with ambulation. No specific cough, sputum production, or active upper respiratory infection. The patient has had weakness in the extremities, bed  rest yesterday, increasing lethargy. Daughter reports decreased urine output, not eating for 6 days, maximum food has been 4 ounces of liquid. The patient has immediate green emesis. A 50-pound weight loss since October. See history, otherwise negative x10.   PHYSICAL EXAMINATION:  VITAL SIGNS: Weight was 190, height 5 feet 6 inches. Initial blood pressure in the office was 128/76, temperature 95, pulse 62.    GENERAL:  An elderly Caucasian female, obese, presents in wheelchair.   HEENT: Head is normocephalic. Conjunctiva is pink. Sclera is anicteric. Oral mucosa is dry and intact.   NECK: Supple without lymphadenopathy, thyromegaly.   HEART: Heart tones S1, S2, without murmur.   LUNGS: Clear to auscultation anteriorly and posteriorly. Respirations are nonlabored.   ABDOMEN: Soft, obese, bowel sounds are present. She has tenderness diffuse bilateral, not tympanic. Dr. Vira Agar in to examine, marked improvement from a November admission with hematoma problem.  RECTAL: Deferred as it was unsafe to put the patient on the table.   SKIN: Sallow, becomes diaphoretic and clammy when sitting, poor turgor. There is scant edema in the lower extremities. No discrete rash.   MUSCULOSKELETAL: The patient was not ambulated, very weak bilaterally.   NEUROLOGIC: Hearing loss. The patient is initially alert, becomes more lethargic as she sits in the chair, with a hypotensive episode, 80/palp blood pressure.   IMPRESSION: The patient has failure to thrive, presents with dehydration, poor oral intake, nausea and vomiting, hypotension. Possible obstipation/constipation and known problem  with pseudo-volvulus and atonic colon. CBC returned unremarkable. Metabolic panel returned with renal failure.  PLAN: Dr. Vira Agar in to evaluate the patient. He consulted with Dr. Caryl Comes. A decision was made to put the patient in the hospital for continued evaluation and management. Further recommendations to include EGD when  patient is more stable.    These services provided by Joelene Millin A. Jerelene Redden, MS, APRN, BC, ANP, under  collaborative agreement with Manya Silvas, MD.  ____________________________ Janalyn Harder Jerelene Redden, ANP kam:cbb D: 04/30/2011 17:07:53 ET  T: 04/30/2011 17:34:03 ET JOB#: [id number]  Joelene Millin A. Sherlyn Hay, MSN, ANP-BC Adult Nurse Practitioner ELECTRONICALLY SIGNED 05/01/2011 8:50

## 2014-07-29 NOTE — Consult Note (Signed)
If patient is to have colectomy and a feeding gastrostomy is needed I could give endoscopic assistance. Would need to be notified in advance.  Thursday afternoon would be available i think.  Electronic Signatures: Scot JunElliott, Tulio Facundo T (MD)  (Signed on 12-Feb-13 17:16)  Authored  Last Updated: 12-Feb-13 17:16 by Scot JunElliott, Louden Houseworth T (MD)

## 2014-07-29 NOTE — Consult Note (Signed)
Chief Complaint:   Subjective/Chief Complaint doing well, denies abdominal pain or nausea, now on elemental tube feeds.   VITAL SIGNS/ANCILLARY NOTES: **Vital Signs.:   10-Feb-13 14:06   Vital Signs Type Routine   Temperature Temperature (F) 98.8   Celsius 37.1   Temperature Source oral   Pulse Pulse 62   Pulse source per Dinamap   Respirations Respirations 20   Systolic BP Systolic BP 98   Diastolic BP (mmHg) Diastolic BP (mmHg) 54   Mean BP 68   BP Source Dinamap   Pulse Ox % Pulse Ox % 94   Pulse Ox Activity Level  At rest   Oxygen Delivery Room Air/ 21 %   Brief Assessment:   Cardiac Regular    Respiratory clear BS    Gastrointestinal details normal Soft  Nontender  Nondistended  No masses palpable  Bowel sounds normal   Routine Hem:  09-Feb-13 05:56    Hemoglobin (CBC) 9.9   Platelet Count (CBC) 159   Assessment/Plan:  Assessment/Plan:   Assessment 1) 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  Dr Mechele CollinElliott with be back tomorrow.   Electronic Signatures: Barnetta ChapelSkulskie, Lela Gell (MD)  (Signed 10-Feb-13 18:05)  Authored: Chief Complaint, VITAL SIGNS/ANCILLARY NOTES, Brief Assessment, Lab Results, Assessment/Plan   Last Updated: 10-Feb-13 18:05 by Barnetta ChapelSkulskie, Watson Robarge (MD)

## 2014-07-29 NOTE — Consult Note (Signed)
Dubhoff curled in esophagus, will get radiology to try to advance under floro control.  Nurse to call me when returns from x-ray to begin feeding instructions.  Electronic Signatures: Scot JunElliott, Robert T (MD)  (Signed on 05-Feb-13 16:07)  Authored  Last Updated: 05-Feb-13 16:07 by Scot JunElliott, Robert T (MD)

## 2014-07-29 NOTE — Consult Note (Signed)
Chief Complaint:   Subjective/Chief Complaint no nausea or abdominal pain.   VITAL SIGNS/ANCILLARY NOTES: **Vital Signs.:   09-Feb-13 04:55   Vital Signs Type Routine   Temperature Temperature (F) 97.8   Celsius 36.5   Temperature Source oral   Pulse Pulse 71   Pulse source per Dinamap   Respirations Respirations 18   Systolic BP Systolic BP 594   Diastolic BP (mmHg) Diastolic BP (mmHg) 74   Mean BP 88   BP Source Dinamap   Pulse Ox % Pulse Ox % 97   Pulse Ox Activity Level  At rest   Oxygen Delivery Room Air/ 21 %   Brief Assessment:   Cardiac Regular    Respiratory clear BS    Gastrointestinal details normal Nontender  No rebound tenderness  moderate distension, bowel sounds distant   Hepatic:  08-Feb-13 04:22    Bilirubin, Total 0.5   Alkaline Phosphatase 35   SGPT (ALT) 23   SGOT (AST) 30   Total Protein, Serum 5.5   Albumin, Serum 2.6  Routine Chem:  08-Feb-13 04:22    Glucose, Serum 79   BUN 16   Creatinine (comp) 0.91   Sodium, Serum 140   Potassium, Serum 3.4   Chloride, Serum 107   CO2, Serum 20   Calcium (Total), Serum 8.6   Anion Gap 13   Osmolality (calc) 280   eGFR (African American) >60   eGFR (Non-African American) >60  Routine UA:  08-Feb-13 18:56    Color (UA) Yellow   Clarity (UA) Hazy   Glucose (UA) Negative   Bilirubin (UA) Negative   Ketones (UA) Negative   Specific Gravity (UA) 1.020   Blood (UA) 1+   pH (UA) 5.0   Protein (UA) Negative   Nitrite (UA) Negative   Leukocyte Esterase (UA) Negative   RBC (UA) 18 /HPF   WBC (UA) 5 /HPF   Epithelial Cells (UA) 1 /HPF   Mucous (UA) PRESENT  Routine Hem:  09-Feb-13 05:56    Platelet Count (CBC) 159   Assessment/Plan:  Assessment/Plan:   Assessment 1) colonic pseudobstruction in the setting of atonic colon. possible partial sigmoid volvulus    Plan 1) colonoscopyu today.  I have discussed the risks benefits and complications of colonoscopy to include not limited to bleeding  infection perforation and she and family wish to proceed.   Electronic Signatures: Loistine Simas (MD)  (Signed 09-Feb-13 08:32)  Authored: Chief Complaint, VITAL SIGNS/ANCILLARY NOTES, Brief Assessment, Lab Results, Assessment/Plan   Last Updated: 09-Feb-13 08:32 by Loistine Simas (MD)

## 2014-07-29 NOTE — Consult Note (Signed)
Pt feels better, no vomiting, no abd pain, asking for food.  Will advance to full liquids and see how she does. Abd not tender, not distended. bowel sounds present.  Electronic Signatures: Scot JunElliott, Robert T (MD)  (Signed on 25-Jan-13 17:03)  Authored  Last Updated: 25-Jan-13 17:03 by Scot JunElliott, Robert T (MD)

## 2014-07-29 NOTE — Consult Note (Signed)
PATIENT NAME:  Brittney Hall, Brittney Hall MR#:  [id number] DATE OF BIRTH:  07-26-1929  DATE OF CONSULTATION:  05/14/2011  REFERRING PHYSICIAN: Lynnae Prudeobert Elliott, MD   CONSULTING PHYSICIAN:  Ruffin FrederickAnn M. Thomasena Edisollins, PA/Wilton Katrinka BlazingSmith, MD  PRIMARY CARE PHYSICIAN:  Daniel NonesBert Klein, MD   REASON FOR CONSULTATION: Nausea, vomiting, and dehydration.   HISTORY OF PRESENT ILLNESS: This is a 81 year old female who was admitted last week with significant dehydration, most likely due to poor oral intake over the last month or two. The patient was last in the hospital in November where she was found to have atonic colon and pseudovolvulus. She was [facility] and daughter reports that she has been declining, particularly over the last month, getting weaker and not progressing with physical therapy. She was able to walk some with the walker, but had to stop frequently due to shortness of breath. The patient has had continued problems with nausea. She was taking Zofran initially. They went to the Emergency Room in December and she was started on Reglan. This initially helped some with the nausea and vomiting, but not for very long. The patient's daughter reports that over the last month she has generally been getting about 4 ounces of liquid nutrition per day. The patient does not want to eat. If she does eat, she usually throws it back up. She denies having any abdominal pain. Daughter reports she has lost about 30 pounds. The patient denies dysphagia. She does not have regular bowel movements. She usually has mucus and only liquid stool. This typically does not happen without enema suppository or other stimulant laxative. Since she has been here, she has had at least 24 hours of enteral nutrition through a Dobbhoff feeding tube. The rate was increased to 30 milliliters. She does seem to be tolerating this at this point without any nausea or vomiting. She has not had any bowel movement and daughter feels like  her abdomen is becoming more distended.   PAST MEDICAL HISTORY:  1. History of atonic colon. Endoscopy performed during her last hospitalization in November 2012. 2. History of pulmonary embolism, status post IVC filter placement.  3. History of rectus sheath hematoma 2012. 4. History of depression and anxiety.  5. Hypothyroidism.  6. Degenerative joint disease. 7. Gastroesophageal reflux disease. 8. History of bladder difficulties with urinary obstruction.  9. Hypertension.  10. Chronic obstructive pulmonary disease.   PAST SURGICAL HISTORY:  1. Appendectomy.  2. Cholecystectomy.  3. Right knee replacement.   MEDICATIONS:  1. Tylenol 650 mg every four hours as needed. 2. Celexa 10 mg daily.  3. Synthroid 0.1 mg q.a.m.  4. Seroquel 25 mg oral at bedtime.  5. Marinol 2.5 mg b.i.d.  6. Keflex 250 mg every eight hours. 7. Heparin 5,000 units subcutaneous q.12h.  8. Protonix 40 mg b.i.d.  9. Prednisone 10 mg daily.   ALLERGIES: No known drug allergies.   REVIEW OF SYSTEMS: As above in the history of present illness. The patient is also currently being treated for a urinary tract infection. When she was admitted, she was in acute renal failure and had a Foley placed. This is improving with hydration.   PHYSICAL EXAMINATION:  GENERAL: Obese elderly Caucasian female in no acute distress.   VITAL SIGNS: Height 66 inches, weight 209 pounds, temperature 98., pulse 67, respirations 20, blood pressure 99/66, pulse oximetry 94% on room air.   HEENT: Pupils equal, round, and reactive to light. Nasogastric tube in place. Oropharynx clear.  CARDIOVASCULAR: Heart regular rate and rhythm. No murmurs.   CHEST: Lungs are clear to auscultation bilaterally. No wheezing. Breathing comfortably.   ABDOMEN: Soft, mildly distended with tympany. Hypoactive bowel sounds, nontender.   EXTREMITIES: 2+ pulses in all four extremities. Muscle strength is weak.   NEUROLOGIC: The patient is awake,  alert, and oriented. Appropriate mood and affect.   LABORATORY, DIAGNOSTIC, AND RADIOLOGICAL DATA: The patient was in acute renal failure on admission with creatinine over 3. This has now returned to normal. Her TSH was low at 0.298. Urine culture last week was positive for Escherichia coli, currently being treated. C. difficile was negative. Initial x-ray showed gaseous distention of the ascending and transverse colon. No significant gas in the descending colon and rectum. Follow-up x-ray earlier this week shows no significant change in the gaseous distention of the large intestine.   IMPRESSION:  1. Atonic colon with pseudovolvulus.  2. Nausea, vomiting and decreased appetite, likely related to #1.   PLAN: I have discussed the case with Dr. Katrinka Blazing who will be by to see the patient later today. The palliative care team has also been involved. A feeding tube would give her more nutrition, but would not solve the problem of the atonic colon. Therefore, it is possible that she may have continued problems with nausea, vomiting and intolerance of nasogastric feedings or gastric feedings. Discussion will need to continue with family and physicians to determine if feeding tube would be helpful for her.    ____________________________ Ruffin Frederick. Thomasena Edis, [place] amc:ap D: 05/14/2011 11:15:57 ET T: 05/14/2011 11:58:32 ET JOB#: [id number]  cc: Ruffin Frederick. Thomasena Edis, [place], <Dictator> ANN M COLLINS PA ELECTRONICALLY SIGNED 05/14/2011 12:49

## 2014-07-29 NOTE — Consult Note (Signed)
In the hallway spoke to Dr. Daniel NonesBert Klein about patient's nutrition status.  I have spoken with Dr. Lynnae Prudeobert Elliott and discussed the consideration of nutritional support.  Dr. Mechele CollinElliott will review Marinol use but he does recommend the consideration of Dobb Hobb feeding tube being placed.  If patient is intolerate to consider proceeding with gastric feeding tube.    Electronic Signatures: Rodman KeyHarrison, Dawn S (NP)  (Signed on 02-Feb-13 13:22)  Authored  Last Updated: 02-Feb-13 13:22 by Rodman KeyHarrison, Dawn S (NP)

## 2014-07-29 NOTE — Consult Note (Signed)
Pt has distended abd, previous atonic colon.  My colonoscopy was done to proximal transverse? due to running out of scope.  I had discussed possible feeding gastrostomy if tube feeding not tolerated.  If she has a bowel resection maybe a temporary feeding tube might be appropriate.  Will leave that to Dr. Katrinka BlazingSmith.  I will be out of town tomorrow and Dr. Marva PandaSkulskie will be on call.  Electronic Signatures: Scot JunElliott, Robert T (MD)  (Signed on 07-Feb-13 13:15)  Authored  Last Updated: 07-Feb-13 13:15 by Scot JunElliott, Robert T (MD)

## 2014-07-29 NOTE — Discharge Summary (Signed)
PATIENT NAME:  Brittney PaceWILLIAMS, Laquanta W MR#:  [id number] DATE OF BIRTH:  1930/04/05  DATE OF ADMISSION:  04/30/2011 DATE OF DISCHARGE:  05/29/2011  FINAL DIAGNOSES:  1. Pseudovolvulus with atonic colon.  2. Dehydration with acute renal failure secondary to #1.  3. History of pulmonary embolism, status post inferior vena cava filter in the remote past.  4. Depression with anxiety.  5. Hypothyroidism.  6. Degenerative joint disease, with requirement for chronic prednisone therapy secondary to multiple medication intolerances.  7. Gastroesophageal reflux disease.  8. Chronic obstructive pulmonary disease, mild.   PRINCIPLE PROCEDURE: Sigmoid colectomy with rigid proctosigmoidoscopy performed 05/21/2011 with finding of partial sigmoid volvulus.   HISTORY AND PHYSICAL: Please see dictated admission history and physical.   SUMMARY OF HOSPITAL COURSE: Patient was admitted with acute renal failure, dehydration, from continued poor oral intake. This has been an issue for months and years, with multiple hospitalizations for dehydration. She has had extensive evaluation, including endoscopies, specialist consultations, and been on multiple medications without improvement. Her initial renal failure improved with hydration, however, oral intake continued to be a significant issue. She was placed on multiple appetite suppressants, including having been on Remeron, Seroquel, and Marinol, without much improvement. GI saw the patient and she underwent endoscopy again, with continued findings of what appeared to be atonic colon. Surgical consultation was obtained, and it was thought that she might respond to removal of the area of atonic colon. This was performed on 05/21/2011. Unfortunately despite this she continued to have very small appetite, poor oral intake and over eight days postoperatively was able to pass some flatus but still no bowel movements despite multiple enemas prior to surgery, as well as at aggressive  regimen after surgery.   Palliative care had been involved with the patient and family members. They discussed potentially proceeding to hospice home given her deterioration in quality of life and deterioration in overall health over the last several months. The patient stated that she was tired of having blood drawn, tests performed, and was interested in palliative measures. After further discussion with patient and family members, they were interested in pursuing hospice home. She will be transferred now to hospice home in stable condition with her physical activity to be up as tolerated. Diet to be as tolerated as well. Her abdominal staples may be removed in four days.   DISCHARGE MEDICATIONS:  1. Roxanol 20 mg/mL, 0.25 to 0.5 mL p.o. q.4 hours p.r.n. severe pain.  2. Lorazepam 0.5 to 1 mg p.o. or sublingually every 2 to 4 hours p.r.n. agitation or anxiety.  3. Zantac 150 mg p.o. b.i.d.  4. ABHR suppository rectally q.4-6 hours p.r.n. nausea or vomiting.  5. Levothyroxine 0.1 mg p.o. daily.  6. Prednisone 10 mg p.o. daily.  7. Senokot-S 1 tablet p.o. b.i.d.   CODE STATUS: Patient is DO NOT RESUSCITATE and out of facility DO NOT RESUSCITATE form was sent with her to the facility.   ____________________________ Lynnea FerrierBert J. Klein III, MD bjk:cms D: 05/29/2011 08:19:16 ET T: 05/29/2011 08:34:02 ET JOB#: [id number]  cc: Lynnea FerrierBert J. Klein III, MD, <Dictator> Daniel NonesBERT KLEIN MD ELECTRONICALLY SIGNED 06/02/2011 7:49

## 2014-07-29 NOTE — Consult Note (Signed)
Chief Complaint:   Subjective/Chief Complaint Patient seen this afternoon.  mild abdominal distension and minimal abdominal pain. No change/stable this evening.   VITAL SIGNS/ANCILLARY NOTES: **Vital Signs.:   08-Feb-13 14:03   Vital Signs Type Routine   Temperature Temperature (F) 98.9   Celsius 37.1   Temperature Source oral   Pulse Pulse 72   Pulse source per Dinamap   Respirations Respirations 18   Systolic BP Systolic BP 737   Diastolic BP (mmHg) Diastolic BP (mmHg) 67   Mean BP 80   BP Source Dinamap   Pulse Ox % Pulse Ox % 94   Pulse Ox Activity Level  At rest   Oxygen Delivery Room Air/ 21 %   Brief Assessment:   Cardiac Regular    Respiratory clear BS    Gastrointestinal details normal Soft  mild distension, bowel sounds positive but distant   Hepatic:  08-Feb-13 04:22    Bilirubin, Total 0.5   Alkaline Phosphatase 35   SGPT (ALT) 23   SGOT (AST) 30   Total Protein, Serum 5.5   Albumin, Serum 2.6  Routine Chem:  08-Feb-13 04:22    Glucose, Serum 79   BUN 16   Creatinine (comp) 0.91   Sodium, Serum 140   Potassium, Serum 3.4   Chloride, Serum 107   CO2, Serum 20   Calcium (Total), Serum 8.6   Anion Gap 13   Osmolality (calc) 280   eGFR (African American) >60   eGFR (Non-African American) >60  Routine UA:  08-Feb-13 18:56    Color (UA) Yellow   Clarity (UA) Hazy   Glucose (UA) Negative   Bilirubin (UA) Negative   Ketones (UA) Negative   Specific Gravity (UA) 1.020   Blood (UA) 1+   pH (UA) 5.0   Protein (UA) Negative   Nitrite (UA) Negative   Leukocyte Esterase (UA) Negative   RBC (UA) 18 /HPF   WBC (UA) 5 /HPF   Epithelial Cells (UA) 1 /HPF   Mucous (UA) PRESENT   Assessment/Plan:  Assessment/Plan:   Assessment 1) intermittant colonic pseudoobstructionand possible partial sigmoid volvulus in the setting of known history of atonic colon.  Request for colonoscopy for further assesment.    Plan 1) case discussed with Dr Tamala Julian.  I will  plan for colonoscopy tomorrow to assist preparation for possible sigmoid colectomy, with history of possible partial sigmoid volvulus.  I have discussed the risks benefits and complications of colonoscopy to include not limited to bleeding infection perforation and sedation and she and family wish to proceed.   Electronic Signatures: Loistine Simas (MD)  (Signed 08-Feb-13 22:31)  Authored: Chief Complaint, VITAL SIGNS/ANCILLARY NOTES, Brief Assessment, Lab Results, Assessment/Plan   Last Updated: 08-Feb-13 22:31 by Loistine Simas (MD)

## 2014-07-29 NOTE — Consult Note (Signed)
I have discussed insertion of small bore NG tube or a Dubhoff and shown these to patient and left them in the room for her to see.  She says she will try to drink 3 cups or containers of nourishment a day to avoid the tube.  Will give her 24 hours to see if she can do this. She has a very thin nose and I suspect difficulty getting a feeding tube down.  PEG tubes are not without complications and would be hesitant to go to this if any other way possible. Will check back tomorrow.  If we have to put tube in I will attempt it with iv mild sedation and xylocaine lubricant.  Electronic Signatures: Scot JunElliott, Lux Skilton T (MD)  (Signed on 03-Feb-13 12:13)  Authored  Last Updated: 03-Feb-13 12:13 by Scot JunElliott, Retina Bernardy T (MD)

## 2014-07-29 NOTE — Consult Note (Signed)
Pt improved, color better, eating a little, no further vomiting.  Pt with renal failure and a UTI.  Hydration with signif improvement in bun and creatinine. Will do EGD tomorrow due to recent severe vomiting.    Electronic Signatures: Scot JunElliott, Travoris Bushey T (MD)  (Signed on 28-Jan-13 18:45)  Authored  Last Updated: 28-Jan-13 18:45 by Scot JunElliott, Athanasius Kesling T (MD)

## 2014-07-29 NOTE — Consult Note (Signed)
Pt known to me from severe problems in the fall, intestinal pseudo obstruction, failure to thrive.  Came to office with hx of poor oral intake for a month, weakenss, diaphoresis, was hypotensive with systolic BP of 80, renal failure with  creatinine of 3.5, bun of 65.  Hx of vomiting for a week when try to eat food. Vomiting of green fluid indicates that there is likely no obstruction of second portion, bulb, or pylorus.  Will follow and likely scope for upper endo when improved.  Electronic Signatures: Scot JunElliott, Naia Ruff T (MD)  (Signed on 24-Jan-13 17:16)  Authored  Last Updated: 24-Jan-13 17:16 by Scot JunElliott, Alphons Burgert T (MD)

## 2014-07-29 NOTE — Consult Note (Signed)
Pt drank 3 shakes today but said she does not think she can keep this up.  Will make her NPO after MN and insert feeding tube tomorrow morning, mid morning.  Electronic Signatures: Scot JunElliott, Viktoria Gruetzmacher T (MD)  (Signed on 04-Feb-13 17:20)  Authored  Last Updated: 04-Feb-13 17:20 by Scot JunElliott, Robey Massmann T (MD)

## 2014-07-29 NOTE — Op Note (Signed)
PATIENT NAME:  Brittney Hall, STAKES MR#:  [id number] DATE OF BIRTH:  June 29, 1929  DATE OF PROCEDURE:  05/21/2011  PREOPERATIVE DIAGNOSIS: Colonic pseudoobstruction.   POSTOPERATIVE DIAGNOSIS: Partial sigmoid volvulus.   PROCEDURE: Sigmoid colectomy, rigid proctosigmoidoscopy.   SURGEON: Renda Rolls, MD  ASSISTANT: Carmell Austria, PA  ANESTHESIA: General.   INDICATIONS: This 81 year old female came in with chronic problems with distention of the abdomen. She had been in the hospital in November and December and now again in February with the same problem with markedly redundant sigmoid colon with marked dilatation and with the sigmoid colon extended up well into the epigastrium and then back down to the pelvis. She has failed medical management and now is brought for sigmoid resection.  DESCRIPTION OF PROCEDURE: The patient was placed on the operating table in the supine position under general endotracheal anesthesia. The legs were elevated into the lithotomy position and a rigid proctosigmoidoscopy was done aspirating 300 mL of liquid stool and also aspirating some gas. The mucosa appeared normal, no polyp or tumor was seen, and after aspirating all of the liquid stool possible, the scope was removed.   She was returned to the supine position. The abdomen was prepared with ChloraPrep and draped in a sterile manner.   A lower abdominal midline incision was made from just to the left of the umbilicus down to the pubic symphysis and carried down through subcutaneous tissues. Numerous small bleeding points were cauterized. The midline fascia was incised and the peritoneal cavity was opened. Initial inspection revealed the presence of her Foley catheter balloon, in the bladder. There was a markedly dilated sigmoid colon and this was brought out onto the abdominal wall and it came out very easily. The bowel appeared to be partially twisted and there appeared to be some scarring in the mesentery. There were  some adhesions also which were lysed. The bowel was markedly distended. I determined which part was the descending colon and also the distal bowel and elected to resect this segment. A Balfour retractor was used. The proximal margin of resection was selected and a window was created in the mesentery. The dissection of the mesentery was begun with the Harmonic scalpel. Next, a distal margin of resection was selected and an opening made in the mesentery, and the mesenteric dissection from that point was also started with the Harmonic scalpel. The mesentery was divided with the use of Harmonic scalpel. Several small bleeding points were suture ligated with 0 chromic and the mesenteric dissection was completed. I then placed two segments of bowel to be joined together adjacent to one another. I elected to use the triangulation technique for the anastomosis. Bowel clamps were placed on the specimen proximally and distally and the bowel was initially divided distally and the distal ends held with Allis clamps. VitaGuard suction was passed down into the bowel and there was no significant stool or air distally. Next, the proximal portion of bowel was divided with electrocautery and several parts of it divided with the Harmonic scalpel for hemostasis. The specimen was some 24 inches in length and was submitted in formalin for routine pathology. There was no grossly palpable mass. Next, I did pass the VitaGuard proximally and there was minimal degree of gas proximally, just a few bits of solid stool identified. Next, the anastomosis was carried out with triangulation technique beginning along the posterior wall with the TA-60 stapler. The proximal bowel was smaller caliber than the distal bowel and did incise the antimesenteric border proximally  to make the length of bowel more equal for anastomosis. The anastomosis was completed with two additional applications of the TA-60 stapler. The anastomosis looked good. Several small  bleeding points were cauterized and hemostasis subsequently appeared to be intact. The mesenteric defect was closed with running 3-0 chromic. It is noted that gloves were changed after completing the anastomosis. Next, the bowel was returned to the abdominal cavity, the peritoneum was closed with running 0 chromic, and then the fascia was closed with interrupted 0 Maxon figure-of-eight sutures. The skin was closed with clips. Dressings were applied with paper tape. The patient tolerated surgery satisfactorily and was then prepared for transfer to the Recovery Room. ___________________________ Shela CommonsJ. Renda RollsWilton Smith, MD jws:slb D: 05/21/2011 14:00:23 ET     T: 05/21/2011 14:26:07 ET       JOB#: [id number] cc: Adella HareJ. Wilton Smith, MD, <Dictator> Adella HareWILTON J SMITH MD ELECTRONICALLY SIGNED 05/23/2011 12:35

## 2014-07-29 NOTE — H&P (Signed)
PATIENT NAME:  Brittney Hall, [name] MR#:  [id number] DATE OF BIRTH:  1929-07-25  DATE OF ADMISSION:  04/30/2011  CHIEF COMPLAINT: Refractory nausea and vomiting.   HISTORY OF PRESENT ILLNESS: The patient is a 81 year old female who has had multiple hospitalizations over the last one year for recurrent episodes of shortness of breath. During those evaluations, she has been found to have refractory abdominal distention, atonic colon. She had evaluation in the middle of the year which included right and left heart catheterizations which revealed no significant coronary artery disease. She has history of pulmonary emboli, has an IVC filter in place, but suffered a rectus sheath bleed and so had been off anticoagulation secondary to this and to completing an extended course of Coumadin previously. She has had admissions previously with shortness of breath which appeared to be related to dehydration. Family members report that over the last at least one month, possibly longer, she has had less appetite. The story is challenging to determine but appears to be a combination of poor appetite in general but more recently has developed nausea and vomiting, particularly postprandially. She has not had abdominal pain. She has had some episodes of chills per report without fever clearly documented. She has lost about 22 pounds over the course of the last two months. She came in to see Gastroenterology today for further follow-up. On evaluation she appeared to be pale and became diaphoretic with systolic blood pressure dropping into the 80's. She has developed progressive weakness and now is to the point that she is unable to ambulate independently. She is admitted now for evidence of dehydration with hypotension. She had labs performed in the office and these are not yet available for evaluation.   PAST MEDICAL HISTORY:  1. History of atonic colon with pseudo volvulus, endoscopy performed November 2012.  2. History of  recurrent dehydration with admission for acute renal failure November 2012.  3. History of pulmonary embolism including episode when she was on anticoagulation, status post IVC filter placement.  4. History of rectus sheath hematoma with acute blood loss anemia 2012.  5. Depression with anxiety.  6. Hypothyroidism.  7. Degenerative joint disease, on chronic prednisone therapy secondary to intolerance of multiple pain medications and anti-inflammatory medications.  8. Gastroesophageal reflux disease.  9. History of active bladder with previous hospitalization for urinary obstruction.  10. Hypertension.  11. Chronic obstructive pulmonary disease.  12. History of appendectomy.  13. Status post cholecystectomy.  14. History of right knee replacement.  15. History of cardiac catheterization, both left and right in 2012, without significant coronary artery disease.   ALLERGIES: No known drug allergies.   MEDICATIONS:  1. Zyrtec 10 mg p.o. daily.  2. Citalopram 10 mg p.o. daily.  3. Levothyroxine 0.125 mg p.o. daily.  4. Pantoprazole 40 mg p.o. b.i.d.  5. Potassium 10 mEq p.o. b.i.d.  6. Prednisone 5 mg daily.  7. Senna Plus 1 tablet two times a day.  8. Reglan 5 mg p.o. b.i.d., added recently in the Emergency Room for nausea and vomiting.  9. Torsemide 20 mg daily.  10. Remeron 30 mg p.o. at bedtime, dose increased on 1/14 2013.  11. Align 1 capsule daily, started on 04/21/2011.   SOCIAL HISTORY: No alcohol or tobacco.   FAMILY HISTORY: Coronary artery disease.   REVIEW OF SYSTEMS: Please see history of present illness. No dysphagia but vomiting is frequent postprandial, although she has also had nausea and vomiting without eating. No chest pain. Shortness of breath  has been chronic, worse with exertion or bending. No bowel movement for approximately one week. No dysuria. No rash. Edema resolved. Remainder of complete review of systems is negative.   PHYSICAL EXAMINATION:   VITALS:  Weight 190, blood pressure 80 over palp, pulse 90.   GENERAL: Elderly female, pale in color and appears ill.   EYES: Pupils round and reactive to light. Lids and conjunctivae are unremarkable.   EARS, NOSE, AND THROAT: External examination unremarkable. Oropharynx is dry without lesions.   NECK: Supple. Trachea in the midline. No thyromegaly.   CARDIOVASCULAR: Regular rate and rhythm without significant gallops or rubs. Pulses are 1+ in the carotids and radials.   LUNGS: Clear bilaterally. No retractions.   ABDOMEN: Soft, distended, with quiet bowel sounds. Mild tenderness to deep palpation in the epigastric region without guard, rebound, or Murphy's.   SKIN: Sallow with poor turgor.   LYMPH NODES: No cervical or supraclavicular nodes.   MUSCULOSKELETAL: No clubbing or cyanosis. No significant edema.   NEUROLOGIC: Hearing loss, otherwise cranial nerves intact. Generalized weakness without focal findings.   IMPRESSION AND PLAN:  1. Dehydration. Place on IV fluids. Await her labs but would suspect she is back in acute renal failure and suspect her hypotension is related this. Source of her dehydration appears to be very poor oral intake, etiology unclear, as well as continued use of diuretic. Holding diuretic at this point. Will get GI consultation for refractory nausea and vomiting. Further investigation will be dependent on what her renal function looks like. Ultimately would get CT but if her creatinine is up will need to consider doing upper GI with small bowel follow-through. She had EGD and colonoscopy at her previous hospitalization.  2. History of anemia. Will await CBC.  3. Atonic colon. Strongly suspect this may be continuing to contribute to the above. Will get three-way of the abdomen and look for any fecal impaction.  4. Dyspepsia. Continue on Protonix but will make this IV for now until it is clear she is able to keep her medications down.  5. Chronic obstructive pulmonary  disease. No evidence of wheezing on exam today. Follow exam closely.  6. History of DVT, has IVC filter in place. Will hold on any DVT prophylaxis for now until we see what her renal function looks like. Put in place TED hose for now.  7. Osteoarthritis. She has been on chronic prednisone. Will increase this to Solu-Medrol 30 mg daily to give her stress dose steroids as well as provide further protection from worsening COPD. The risks/benefits of this have been previously discussed on multiple occasions with the family members.   ____________________________ Lynnea Ferrier, MD bjk:drc D: 04/30/2011 13:18:24 ET T: 04/30/2011 13:40:46 ET JOB#: [id number]  cc: Lynnea Ferrier, MD, <Dictator> Daniel Nones MD ELECTRONICALLY SIGNED 05/08/2011 7:41

## 2014-07-29 NOTE — Consult Note (Signed)
Placed Dubhoff tube at bedside.  Will send her down for X-ray for tube placement.    Electronic Signatures: Scot JunElliott, Robert T (MD)  (Signed on 05-Feb-13 13:51)  Authored  Last Updated: 05-Feb-13 13:51 by Scot JunElliott, Robert T (MD)

## 2014-07-29 NOTE — Consult Note (Signed)
VSS afebrile, abd exam shows mild distention, bowel sounds present, no organomegaly.  She had several enemas per her report.  If wanted to do more prep could stop tube feeding and give mag citrate per Dubhoff tube.  Pt examined at bedside.  Electronic Signatures: Scot JunElliott, Ledell Codrington T (MD)  (Signed on 11-Feb-13 18:31)  Authored  Last Updated: 11-Feb-13 18:31 by Scot JunElliott, Elidia Bonenfant T (MD)

## 2014-07-29 NOTE — Consult Note (Signed)
PATIENT NAME:  Brittney Hall, Brittney Hall MR#:  [id number] DATE OF BIRTH:  [birth year]  DATE OF CONSULTATION:  05/01/2011  REFERRING PHYSICIAN:  Ramonita Lab, MD  CONSULTING PHYSICIAN:  Tama High, MD  REASON FOR CONSULTATION: Acute renal failure, hyperkalemia, metabolic acidosis.   HISTORY OF PRESENT ILLNESS: The patient is a 81 year old Caucasian female with past medical history of atonic colon with pseudo volvulus found on endoscopy in November of 2012, recurrent dehydration with a prior episodes of acute renal failure, pulmonary embolism with history of IVC filter placement, rectus sheath hematoma with acute blood loss anemia in 2012, depression with anxiety, hypothyroidism, degenerative joint disease, gastroesophageal reflux disease, prior history of urinary obstruction, hypertension, and chronic obstructive pulmonary disease who presented to [facility] yesterday with refractory nausea and vomiting. The patient is currently quite lethargic and unable to provide history. However, the patient's daughter is at bedside and is very familiar with her medical history. The patient's daughter states that her mother has not been eating very well at [facility] for at least the past month. She has had quite limited intake of both solids as well as liquids. The patient has recent history of atonic colon with pseudo volvulus which was found in November of 2012. Since that point in time, she developed abdominal distention which ended up causing shortness of breath per the patient's daughter's report. Renal ultrasound was performed today which demonstrated an atrophic left kidney of 5.2 cm. The right kidney was measured as being 9.6 cm. Therefore, it appears that she does have underlying chronic kidney disease as a result of atrophic left kidney. The patient's baseline creatinine appears to be 1.2. On her BMP the patient has evidence for hyperkalemia with a potassium of 5.7 and metabolic acidosis with a  serum bicarbonate of 15. ABG showed pH of 7.36. The patient was administered Kayexalate earlier today for treatment of the hyperkalemia. Repeat potassium is currently pending. The patient's creatinine upon admission was 3.35. It then rose to 4.09. However, creatinine is now down to 3.41. The patient is receiving IV fluid hydration with normal saline at 125 mL per hour. In addition, it is noted that the patient was on torsemide while at [facility] as well to treat chronic lower extremity edema.   PAST MEDICAL HISTORY:  1. Atonic colon with pseudo volvulus with endoscopy performed in November of 2012.  2. Recurrent dehydration with history of acute renal failure.  3. History of pulmonary embolism and DVT status post IVC filter placement.  4. Atrophic left kidney with chronic kidney disease, stage III.  5. Rectus sheath hematoma with acute blood loss anemia in 2012.  6. Depression and anxiety.  7. Hypothyroidism.  8. Degenerative joint disease.  9. Gastroesophageal reflux disease.  10. Hypertension.  11. Chronic obstructive pulmonary disease.   PAST SURGICAL HISTORY:  1. Appendectomy.  2. Cholecystectomy.  3. Right knee replacement.  4. History of right and left cardiac catheterization in 2012 without significant coronary artery disease.   ALLERGIES: No known drug allergies.   CURRENT INPATIENT MEDICATIONS:  1. Tylenol 650 mg p.o. q.4 hours p.r.n.  2. Celexa 10 mg daily.  3. Solu-Medrol 30 mg IV daily.  4. Protonix 40 mg IV b.i.d.  5. Synthroid 0.1 mg p.o. q.6 a.m.  6. Normal saline at 125 mL/h.   SOCIAL HISTORY: The patient currently resides at [facility]. She has one daughter. She is widowed. No reported tobacco, alcohol, or illicit drug use.   FAMILY HISTORY: The patient's mother deceased  of breast cancer. Unclear how her father died.  REVIEW OF SYSTEMS: Currently unobtainable from the patient as she is quite lethargic and unable to provide any review of systems.   PHYSICAL  EXAMINATION:   VITAL SIGNS: Temperature 98, pulse 86, respirations 20, blood pressure 105/66, pulse oximetry 93% on room air.   GENERAL: Well developed, well nourished Caucasian female who appears her stated age currently in no acute distress.   HEENT: Normocephalic, atraumatic. Spontaneous extraocular movements are noted. Pupils were equal, round, and reactive to light. No scleral icterus. Conjunctivae are pink. No epistaxis noted. Oral mucosa moist.   NECK: Supple without JVD or lymphadenopathy.   LUNGS: Clear to auscultation bilaterally with normal respiratory effort.   CARDIOVASCULAR: S1, S2 regular rate and rhythm. No murmurs, rubs, or gallops appreciated.   ABDOMEN: Soft, nontender, nondistended. Bowel sounds are present. No rebound or guarding. No gross organomegaly appreciated. No sign of rectus sheath hematoma at this point in time.   EXTREMITIES: No clubbing or cyanosis noted. Trace bilateral lower extremity edema noted. Compression stockings are on.   NEUROLOGIC: The patient is quite lethargic but is arousable. She did follow simple commands.   GU: No suprapubic tenderness encountered.  MUSCULOSKELETAL: No joint redness, swelling, or tenderness appreciated. Scar from right knee replacement noted.   INTEGUMENTARY: No skin rashes or lesions.   PSYCHIATRIC: Unable to assess at this time.   LABORATORY DATA: Ultrasound kidneys shows right kidney 9.68 cm, left kidney was measured at being 5.2 cm. Multiple small bilateral renal cysts noted.   Lactic acid 0.8. ABG shows pH 7.36, pCO2 27, pO2 69, FiO2 21. CBC shows WBC 10.9, hemoglobin 12.4, hematocrit 36, platelets 133. Troponin negative x2 sets. LFTs show total bilirubin 1.1, direct bilirubin 0.4, alkaline phosphatase 49, ALT 26, AST 29, total protein 7.5, ESR 14. TSH 0.298.   IMPRESSION: The patient is a 81 year old Caucasian female with past medical history of atonic colon with pseudo volvulus diagnosed November of 2012,  recurrent dehydration with acute renal failure in the past, PE/DVT status post IVC filter placement, history of rectus sheath hematoma with acute blood loss anemia in 2012, chronic kidney disease stage III, baseline EGFR 43, depression, hypothyroidism, degenerative joint disease, gastroesophageal reflux disease, hypertension, and chronic obstructive pulmonary disease who presented to [facility] with poor p.o. intake x1 month, dehydration, acute renal failure. 1. Acute renal failure. It appears that this is most likely due to dehydration. The patient has had very poor p.o. intake for over a month and was also on diuretic therapy. I agree with Dr. Caryl Comes that dehydration is longstanding as may be her renal failure. No acute indication to proceed with dialysis immediately, however, we will continue to monitor for this possibility. For now we will continue IV fluid hydration. Avoid nephrotoxins as possible. We will place a Foley catheter for closer urine output monitoring.  2. Chronic kidney disease, stage III. The patient appears to have an atrophic kidney based on the renal ultrasound, though the left kidney was not optimally visualized. Left kidney measured 5.6 cm. Her baseline creatinine is 1.2 with an eGFR of 43. Would avoid ACE inhibitor or ARB at this point in time. It appears that the patient is prone to recurrent dehydration. Therefore, we may need to avoid ACE inhibitor and ARB long-term in this patient.  3. Hyperkalemia. Potassium was noted as being 5.7. The patient was administered oral Kayexalate earlier. We recommend administering rectal Kayexalate in this patient given her history of atonic  colon. Repeat potassium pending.  4. Metabolic acidosis. Serum bicarbonate noted to be low at 15. The acidosis is certainly contributing to the underlying hyperkalemia. Therefore, we will discontinue normal saline in favor of sodium bicarbonate drip at 75 mL per hour.   I would like to thank  Dr. Caryl Comes for this kind referral. Further plan as the patient progresses.   ____________________________ Tama High, MD [id number] D: 05/01/2011 13:29:57 ET T: 05/01/2011 13:58:06 ET JOB#: [id number]  cc: Tama High, MD, <Dictator> Mariah Milling Emilyanne Mcgough MD ELECTRONICALLY SIGNED 05/15/2011 20:54

## 2014-07-29 NOTE — Consult Note (Signed)
I will sign off, reconsult if needed.  Electronic Signatures: Scot JunElliott, Harriet Sutphen T (MD)  (Signed on 01-Feb-13 15:46)  Authored  Last Updated: 01-Feb-13 15:46 by Scot JunElliott, Finleigh Cheong T (MD)

## 2014-07-29 NOTE — Consult Note (Signed)
Agree with trial of Marinol for poor appetite.  May be due to renal failure and UTI but may be partly  due to dementia. If marinol not helpful or not tolerated consider trial of prednisone 20mg  a day or so for short term trial at appetitie stimulus.  Electronic Signatures: Scot JunElliott, Teasha Murrillo T (MD)  (Signed on 30-Jan-13 13:18)  Authored  Last Updated: 30-Jan-13 13:18 by Scot JunElliott, Yesha Muchow T (MD)
